# Patient Record
Sex: Male | Born: 1955 | Race: White | Hispanic: No | Marital: Married | Smoking: Former smoker
Health system: Southern US, Community
[De-identification: ages and names within clinical notes are randomized; demographics above are authoritative.]

## PROBLEM LIST (undated history)

## (undated) DIAGNOSIS — I10 Essential (primary) hypertension: Secondary | ICD-10-CM

## (undated) DIAGNOSIS — E119 Type 2 diabetes mellitus without complications: Secondary | ICD-10-CM

## (undated) DIAGNOSIS — F32A Depression, unspecified: Secondary | ICD-10-CM

## (undated) DIAGNOSIS — F419 Anxiety disorder, unspecified: Secondary | ICD-10-CM

## (undated) DIAGNOSIS — F329 Major depressive disorder, single episode, unspecified: Secondary | ICD-10-CM

## (undated) DIAGNOSIS — E7800 Pure hypercholesterolemia, unspecified: Secondary | ICD-10-CM

## (undated) DIAGNOSIS — M1990 Unspecified osteoarthritis, unspecified site: Secondary | ICD-10-CM

---

## 1975-07-18 HISTORY — PX: PILONIDAL CYST EXCISION: SHX744

## 2001-07-15 ENCOUNTER — Other Ambulatory Visit: Admission: RE | Admit: 2001-07-15 | Discharge: 2001-07-15 | Payer: Self-pay | Admitting: Dermatology

## 2002-01-09 ENCOUNTER — Other Ambulatory Visit: Admission: RE | Admit: 2002-01-09 | Discharge: 2002-01-09 | Payer: Self-pay | Admitting: Dermatology

## 2003-05-23 ENCOUNTER — Ambulatory Visit (HOSPITAL_COMMUNITY): Admission: RE | Admit: 2003-05-23 | Discharge: 2003-05-23 | Payer: Self-pay | Admitting: Family Medicine

## 2005-06-05 ENCOUNTER — Other Ambulatory Visit: Admission: RE | Admit: 2005-06-05 | Discharge: 2005-06-05 | Payer: Self-pay | Admitting: Dermatology

## 2014-08-05 NOTE — Patient Instructions (Addendum)
Greg Oneill  08/05/2014   Your procedure is scheduled on:   08/10/2014  Report to [facility] at  51  AM.  Call this number if you have problems the morning of surgery: [phone]   Remember:   Do not eat food or drink liquids after midnight.   Take these medicines the morning of surgery with A SIP OF WATER: lisinopril and lexapro   Do not wear jewelry, make-up or nail polish.  Do not wear lotions, powders, or perfumes.   Do not shave 48 hours prior to surgery. Men may shave face and neck.  Do not bring valuables to the hospital.  [facility] is not responsible for any belongings or valuables.               Contacts, dentures or bridgework may not be worn into surgery.  Leave suitcase in the car. After surgery it may be brought to your room.  For patients admitted to the hospital, discharge time is determined by your treatment team.               Patients discharged the day of surgery will not be allowed to drive home.  Name and phone number of your driver: family  Special Instructions: Shower using CHG 2 nights before surgery and the night before surgery.  If you shower the day of surgery use CHG.  Use special wash - you have one bottle of CHG for all showers.  You should use approximately 1/3 of the bottle for each shower.   Please read over the following fact sheets that you were given: Pain Booklet, Coughing and Deep Breathing, Surgical Site Infection Prevention, Anesthesia Post-op Instructions and Care and Recovery After Surgery Hernia A hernia occurs when an internal organ pushes out through a weak spot in the abdominal wall. Hernias most commonly occur in the groin and around the navel. Hernias often can be pushed back into place (reduced). Most hernias tend to get worse over time. Some abdominal hernias can get stuck in the opening (irreducible or incarcerated hernia) and cannot be reduced. An irreducible abdominal hernia which is tightly squeezed into the opening  is at risk for impaired blood supply (strangulated hernia). A strangulated hernia is a medical emergency. Because of the risk for an irreducible or strangulated hernia, surgery may be recommended to repair a hernia. CAUSES   Heavy lifting.  Prolonged coughing.  Straining to have a bowel movement.  A cut (incision) made during an abdominal surgery. HOME CARE INSTRUCTIONS   Bed rest is not required. You may continue your normal activities.  Avoid lifting more than 10 pounds (4.5 kg) or straining.  Cough gently. If you are a smoker it is best to stop. Even the best hernia repair can break down with the continual strain of coughing. Even if you do not have your hernia repaired, a cough will continue to aggravate the problem.  Do not wear anything tight over your hernia. Do not try to keep it in with an outside bandage or truss. These can damage abdominal contents if they are trapped within the hernia sac.  Eat a normal diet.  Avoid constipation. Straining over long periods of time will increase hernia size and encourage breakdown of repairs. If you cannot do this with diet alone, stool softeners may be used. SEEK IMMEDIATE MEDICAL CARE IF:   You have a fever.  You develop increasing abdominal pain.  You feel nauseous or vomit.  Your hernia  is stuck outside the abdomen, looks discolored, feels hard, or is tender.  You have any changes in your bowel habits or in the hernia that are unusual for you.  You have increased pain or swelling around the hernia.  You cannot push the hernia back in place by applying gentle pressure while lying down. MAKE SURE YOU:   Understand these instructions.  Will watch your condition.  Will get help right away if you are not doing well or get worse. Document Released: 07/03/2005 Document Revised: 09/25/2011 Document Reviewed: 02/20/2008 [facility] Patient Information 2015 [place], [place]. This information is not intended to replace advice given to  you by your health care provider. Make sure you discuss any questions you have with your health care provider. PATIENT INSTRUCTIONS POST-ANESTHESIA  IMMEDIATELY FOLLOWING SURGERY:  Do not drive or operate machinery for the first twenty four hours after surgery.  Do not make any important decisions for twenty four hours after surgery or while taking narcotic pain medications or sedatives.  If you develop intractable nausea and vomiting or a severe headache please notify your doctor immediately.  FOLLOW-UP:  Please make an appointment with your surgeon as instructed. You do not need to follow up with anesthesia unless specifically instructed to do so.  WOUND CARE INSTRUCTIONS (if applicable):  Keep a dry clean dressing on the anesthesia/puncture wound site if there is drainage.  Once the wound has quit draining you may leave it open to air.  Generally you should leave the bandage intact for twenty four hours unless there is drainage.  If the epidural site drains for more than 36-48 hours please call the anesthesia department.  QUESTIONS?:  Please feel free to call your physician or the hospital operator if you have any questions, and they will be happy to assist you.

## 2014-08-05 NOTE — H&P (Signed)
  NTS SOAP Note  Vital Signs:  Vitals as of: 3/49/1791: Systolic 505: Diastolic 80: Heart Rate 78: Temp 59F: Height 59ft 0in: Weight 250Lbs 0 Ounces: BMI 33.91  BMI : 33.91 kg/m2  Subjective: This 59 year old male presents for of an umbilical hernia.  Has been present for several months, made worse with straining.  Review of Symptoms:  Constitutional:unremarkable   Head:unremarkable Eyes:unremarkable   Nose/Mouth/Throat:unremarkable Cardiovascular:  unremarkable Respiratory:unremarkable Gastrointestinal:  unremarkable   Genitourinary:unremarkable   Musculoskeletal:unremarkable Skin:unremarkable Hematolgic/Lymphatic:unremarkable   Allergic/Immunologic:unremarkable   Past Medical History:  Reviewed  Past Medical History  Surgical History: unremarkable Medical Problems: NIDDM, anxiety  Allergies: nkda Medications: vytorin,  lexapro,  metformin,  alfuzosin   Social History:Reviewed  Social History  Preferred Language: English Race:  White Ethnicity: Not Hispanic / Latino Age: 15 year Marital Status:  M Alcohol:  no   Smoking Status: Never smoker reviewed on 08/04/2014 Functional Status reviewed on 08/04/2014 ------------------------------------------------ Bathing: Normal Cooking: Normal Dressing: Normal Driving: Normal Eating: Normal Managing Meds: Normal Oral Care: Normal Shopping: Normal Toileting: Normal Transferring: Normal Walking: Normal Cognitive Status reviewed on 08/04/2014 ------------------------------------------------ Attention: Normal Decision Making: Normal Language: Normal Memory: Normal Motor: Normal Perception: Normal Problem Solving: Normal Visual and Spatial: Normal   Family History:Reviewed  Family Health History Mother, Living; Healthy;  Father, Living; Healthy;     Objective Information: General:Well appearing, well nourished in no distress. Heart:RRR, no murmur or gallop.  Normal S1, S2.   No S3, S4.  Lungs:  CTA bilaterally, no wheezes, rhonchi, rales.  Breathing unlabored. Abdomen:Soft, NT/ND, no HSM, no masses.  Reducible umbilical hernia.  Assessment:Umbilical hernia  Diagnoses: 697.9  Y80.1 Umbilical hernia (Umbilical hernia without obstruction or gangrene)  Procedures: 65537 - OFFICE OUTPATIENT NEW 30 MINUTES    Plan:  Scheduled for umbilical herniorrhaphy with mesh on 08/10/14.   Patient Education:Alternative treatments to surgery were discussed with patient (and family).  Risks and benefits  of procedure including bleeding,  infection,  mesh use,  and recurrence of the hernia were fully explained to the patient (and family) who gave informed consent. Patient/family questions were addressed.  Follow-up:Pending Surgery

## 2014-08-06 ENCOUNTER — Other Ambulatory Visit: Payer: Self-pay

## 2014-08-06 ENCOUNTER — Encounter (HOSPITAL_COMMUNITY): Payer: Self-pay

## 2014-08-06 ENCOUNTER — Encounter (HOSPITAL_COMMUNITY)
Admission: RE | Admit: 2014-08-06 | Discharge: 2014-08-06 | Disposition: A | Discharge status: Home / Self Care | Payer: 59 | Source: Ambulatory Visit | Attending: General Surgery | Admitting: General Surgery

## 2014-08-06 DIAGNOSIS — K429 Umbilical hernia without obstruction or gangrene: Secondary | ICD-10-CM | POA: Insufficient documentation

## 2014-08-06 DIAGNOSIS — Z01818 Encounter for other preprocedural examination: Secondary | ICD-10-CM | POA: Diagnosis not present

## 2014-08-06 HISTORY — DX: Type 2 diabetes mellitus without complications: E11.9

## 2014-08-06 HISTORY — DX: Depression, unspecified: F32.A

## 2014-08-06 HISTORY — DX: Unspecified osteoarthritis, unspecified site: M19.90

## 2014-08-06 HISTORY — DX: Major depressive disorder, single episode, unspecified: F32.9

## 2014-08-06 LAB — CBC WITH DIFFERENTIAL/PLATELET
Basophils Absolute: 0 10*3/uL (ref 0.0–0.1)
Basophils Relative: 1 % (ref 0–1)
Eosinophils Absolute: 0.1 10*3/uL (ref 0.0–0.7)
Eosinophils Relative: 2 % (ref 0–5)
HCT: 40.8 % (ref 39.0–52.0)
Hemoglobin: 14.5 g/dL (ref 13.0–17.0)
Lymphocytes Relative: 28 % (ref 12–46)
Lymphs Abs: 1.4 10*3/uL (ref 0.7–4.0)
MCH: 31.9 pg (ref 26.0–34.0)
MCHC: 35.5 g/dL (ref 30.0–36.0)
MCV: 89.9 fL (ref 78.0–100.0)
Monocytes Absolute: 0.4 10*3/uL (ref 0.1–1.0)
Monocytes Relative: 7 % (ref 3–12)
Neutro Abs: 3.3 10*3/uL (ref 1.7–7.7)
Neutrophils Relative %: 62 % (ref 43–77)
Platelets: 167 10*3/uL (ref 150–400)
RBC: 4.54 MIL/uL (ref 4.22–5.81)
RDW: 12.9 % (ref 11.5–15.5)
WBC: 5.2 10*3/uL (ref 4.0–10.5)

## 2014-08-06 LAB — BASIC METABOLIC PANEL
Anion gap: 9 (ref 5–15)
BUN: 20 mg/dL (ref 6–23)
CO2: 23 mmol/L (ref 19–32)
Calcium: 8.8 mg/dL (ref 8.4–10.5)
Chloride: 104 mEq/L (ref 96–112)
Creatinine, Ser: 0.93 mg/dL (ref 0.50–1.35)
GFR calc Af Amer: >90 mL/min (ref >90)
GFR calc non Af Amer: 89 mL/min — ABNORMAL LOW (ref >90)
Glucose, Bld: 117 mg/dL — ABNORMAL HIGH (ref 70–99)
Potassium: 3.7 mmol/L (ref 3.5–5.1)
Sodium: 136 mmol/L (ref 135–145)

## 2014-08-06 MED ORDER — CHLORHEXIDINE GLUCONATE 4 % EX LIQD: 1.0000 "application " | Freq: Once | CUTANEOUS | Status: DC

## 2014-08-10 ENCOUNTER — Ambulatory Visit (HOSPITAL_COMMUNITY): Payer: 59 | Admitting: Anesthesiology

## 2014-08-10 ENCOUNTER — Encounter (HOSPITAL_COMMUNITY): Payer: Self-pay | Admitting: *Deleted

## 2014-08-10 ENCOUNTER — Encounter (HOSPITAL_COMMUNITY)
Admission: RE | Disposition: A | Discharge status: Home / Self Care | Payer: Self-pay | Source: Ambulatory Visit | Attending: General Surgery

## 2014-08-10 ENCOUNTER — Ambulatory Visit (HOSPITAL_COMMUNITY)
Admission: RE | Admit: 2014-08-10 | Discharge: 2014-08-10 | Disposition: A | Discharge status: Home / Self Care | Payer: 59 | Source: Ambulatory Visit | Attending: General Surgery | Admitting: General Surgery

## 2014-08-10 DIAGNOSIS — E119 Type 2 diabetes mellitus without complications: Secondary | ICD-10-CM | POA: Diagnosis not present

## 2014-08-10 DIAGNOSIS — F419 Anxiety disorder, unspecified: Secondary | ICD-10-CM | POA: Insufficient documentation

## 2014-08-10 DIAGNOSIS — K429 Umbilical hernia without obstruction or gangrene: Secondary | ICD-10-CM | POA: Insufficient documentation

## 2014-08-10 HISTORY — PX: INSERTION OF MESH: SHX5868

## 2014-08-10 HISTORY — PX: UMBILICAL HERNIA REPAIR: SHX196

## 2014-08-10 LAB — GLUCOSE, CAPILLARY
GLUCOSE-CAPILLARY: 106 mg/dL — AB (ref 70–99)
Glucose-Capillary: 117 mg/dL — ABNORMAL HIGH (ref 70–99)

## 2014-08-10 SURGERY — REPAIR, HERNIA, UMBILICAL, ADULT
Anesthesia: General | Site: Abdomen

## 2014-08-10 MED ORDER — KETOROLAC TROMETHAMINE 30 MG/ML IJ SOLN
INTRAMUSCULAR | Status: AC
  Filled 2014-08-10: qty 1

## 2014-08-10 MED ORDER — MIDAZOLAM HCL 2 MG/2ML IJ SOLN
1.0000 mg | INTRAMUSCULAR | Status: DC | PRN
  Administered 2014-08-10: 2 mg via INTRAVENOUS

## 2014-08-10 MED ORDER — CEFAZOLIN SODIUM-DEXTROSE 2-3 GM-% IV SOLR
2.0000 g | INTRAVENOUS | Status: AC
  Administered 2014-08-10: 2 g via INTRAVENOUS
  Filled 2014-08-10: qty 50

## 2014-08-10 MED ORDER — LACTATED RINGERS IV SOLN
INTRAVENOUS | Status: DC
  Administered 2014-08-10: 1000 mL via INTRAVENOUS

## 2014-08-10 MED ORDER — FENTANYL CITRATE 0.05 MG/ML IJ SOLN
25.0000 ug | INTRAMUSCULAR | Status: AC
  Administered 2014-08-10 (×2): 25 ug via INTRAVENOUS

## 2014-08-10 MED ORDER — MIDAZOLAM HCL 2 MG/2ML IJ SOLN
INTRAMUSCULAR | Status: AC
  Filled 2014-08-10: qty 2

## 2014-08-10 MED ORDER — FENTANYL CITRATE 0.05 MG/ML IJ SOLN
INTRAMUSCULAR | Status: AC
  Filled 2014-08-10: qty 5

## 2014-08-10 MED ORDER — BUPIVACAINE HCL (PF) 0.5 % IJ SOLN
INTRAMUSCULAR | Status: DC | PRN
  Administered 2014-08-10: 6 mL

## 2014-08-10 MED ORDER — 0.9 % SODIUM CHLORIDE (POUR BTL) OPTIME
TOPICAL | Status: DC | PRN
  Administered 2014-08-10: 1000 mL

## 2014-08-10 MED ORDER — PROPOFOL 10 MG/ML IV BOLUS
INTRAVENOUS | Status: DC | PRN
  Administered 2014-08-10: 150 mg via INTRAVENOUS

## 2014-08-10 MED ORDER — BUPIVACAINE HCL (PF) 0.5 % IJ SOLN
INTRAMUSCULAR | Status: AC
  Filled 2014-08-10: qty 30

## 2014-08-10 MED ORDER — POVIDONE-IODINE 10 % EX OINT
TOPICAL_OINTMENT | CUTANEOUS | Status: AC
  Filled 2014-08-10: qty 1

## 2014-08-10 MED ORDER — OXYCODONE-ACETAMINOPHEN 7.5-325 MG PO TABS: 1.0000 | ORAL_TABLET | ORAL | Status: DC | PRN

## 2014-08-10 MED ORDER — FENTANYL CITRATE 0.05 MG/ML IJ SOLN
INTRAMUSCULAR | Status: AC
  Filled 2014-08-10: qty 2

## 2014-08-10 MED ORDER — FENTANYL CITRATE 0.05 MG/ML IJ SOLN
INTRAMUSCULAR | Status: DC | PRN
  Administered 2014-08-10: 50 ug via INTRAVENOUS

## 2014-08-10 MED ORDER — ONDANSETRON HCL 4 MG/2ML IJ SOLN: 4.0000 mg | Freq: Once | INTRAMUSCULAR | Status: DC | PRN

## 2014-08-10 MED ORDER — PROPOFOL 10 MG/ML IV BOLUS
INTRAVENOUS | Status: AC
  Filled 2014-08-10: qty 20

## 2014-08-10 MED ORDER — LIDOCAINE HCL (PF) 1 % IJ SOLN
INTRAMUSCULAR | Status: AC
  Filled 2014-08-10: qty 5

## 2014-08-10 MED ORDER — KETOROLAC TROMETHAMINE 30 MG/ML IJ SOLN
30.0000 mg | Freq: Once | INTRAMUSCULAR | Status: AC
  Administered 2014-08-10: 30 mg via INTRAVENOUS

## 2014-08-10 MED ORDER — ONDANSETRON HCL 4 MG/2ML IJ SOLN
INTRAMUSCULAR | Status: AC
  Filled 2014-08-10: qty 2

## 2014-08-10 MED ORDER — POVIDONE-IODINE 10 % OINT PACKET
TOPICAL_OINTMENT | CUTANEOUS | Status: DC | PRN
  Administered 2014-08-10: 1 via TOPICAL

## 2014-08-10 MED ORDER — FENTANYL CITRATE 0.05 MG/ML IJ SOLN: 25.0000 ug | INTRAMUSCULAR | Status: DC | PRN

## 2014-08-10 MED ORDER — LIDOCAINE HCL (CARDIAC) 10 MG/ML IV SOLN
INTRAVENOUS | Status: DC | PRN
  Administered 2014-08-10: 20 mg via INTRAVENOUS

## 2014-08-10 MED ORDER — ONDANSETRON HCL 4 MG/2ML IJ SOLN
4.0000 mg | Freq: Once | INTRAMUSCULAR | Status: AC
  Administered 2014-08-10: 4 mg via INTRAVENOUS

## 2014-08-10 SURGICAL SUPPLY — 37 items
BAG HAMPER (MISCELLANEOUS) ×3 IMPLANT
BLADE 11 SAFETY STRL DISP (BLADE) IMPLANT
CHLORAPREP W/TINT 26ML (MISCELLANEOUS) ×3 IMPLANT
CLOTH BEACON ORANGE TIMEOUT ST (SAFETY) ×3 IMPLANT
COVER LIGHT HANDLE STERIS (MISCELLANEOUS) ×6 IMPLANT
DECANTER SPIKE VIAL GLASS SM (MISCELLANEOUS) ×3 IMPLANT
ELECT REM PT RETURN 9FT ADLT (ELECTROSURGICAL) ×3
ELECTRODE REM PT RTRN 9FT ADLT (ELECTROSURGICAL) ×1 IMPLANT
GLOVE BIOGEL M 7.0 STRL (GLOVE) ×3 IMPLANT
GLOVE BIOGEL PI IND STRL 7.0 (GLOVE) ×2 IMPLANT
GLOVE BIOGEL PI INDICATOR 7.0 (GLOVE) ×4
GLOVE ECLIPSE 6.5 STRL STRAW (GLOVE) ×3 IMPLANT
GLOVE EXAM NITRILE MD LF STRL (GLOVE) ×3 IMPLANT
GLOVE SURG SS PI 7.5 STRL IVOR (GLOVE) ×6 IMPLANT
GOWN STRL REUS W/ TWL XL LVL3 (GOWN DISPOSABLE) ×1 IMPLANT
GOWN STRL REUS W/TWL LRG LVL3 (GOWN DISPOSABLE) ×6 IMPLANT
GOWN STRL REUS W/TWL XL LVL3 (GOWN DISPOSABLE) ×2
INST SET MINOR GENERAL (KITS) ×3 IMPLANT
KIT ROOM TURNOVER APOR (KITS) ×3 IMPLANT
MANIFOLD NEPTUNE II (INSTRUMENTS) ×3 IMPLANT
MESH VENTRALEX ST 1-7/10 CRC S (Mesh General) ×3 IMPLANT
NEEDLE HYPO 25X1 1.5 SAFETY (NEEDLE) ×3 IMPLANT
NS IRRIG 1000ML POUR BTL (IV SOLUTION) ×3 IMPLANT
PACK MINOR (CUSTOM PROCEDURE TRAY) ×3 IMPLANT
PAD ARMBOARD 7.5X6 YLW CONV (MISCELLANEOUS) ×3 IMPLANT
SET BASIN LINEN APH (SET/KITS/TRAYS/PACK) ×3 IMPLANT
SPONGE GAUZE 2X2 8PLY STER LF (GAUZE/BANDAGES/DRESSINGS) ×2
SPONGE GAUZE 2X2 8PLY STRL LF (GAUZE/BANDAGES/DRESSINGS) ×4 IMPLANT
STAPLER VISISTAT (STAPLE) ×3 IMPLANT
SUT ETHIBOND NAB MO 7 #0 18IN (SUTURE) ×3 IMPLANT
SUT VIC AB 2-0 CT2 27 (SUTURE) ×3 IMPLANT
SUT VIC AB 3-0 SH 27 (SUTURE) ×2
SUT VIC AB 3-0 SH 27X BRD (SUTURE) ×1 IMPLANT
SUT VICRYL AB 3 0 TIES (SUTURE) IMPLANT
SYR CONTROL 10ML LL (SYRINGE) ×3 IMPLANT
TAPE CLOTH SURG 4X10 WHT LF (GAUZE/BANDAGES/DRESSINGS) ×3 IMPLANT
YANKAUER SUCT BULB TIP NO VENT (SUCTIONS) ×3 IMPLANT

## 2014-08-10 NOTE — Anesthesia Postprocedure Evaluation (Signed)
  Anesthesia Post-op Note  Patient: Greg Oneill  Procedure(s) Performed: Procedure(s): UMBILICAL HERNIORRHAPHY  (N/A) INSERTION OF MESH (N/A)  Patient Location: PACU  Anesthesia Type:General  Level of Consciousness: awake, alert  and patient cooperative  Airway and Oxygen Therapy: Patient Spontanous Breathing  Post-op Pain: mild  Post-op Assessment: Post-op Vital signs reviewed, Patient's Cardiovascular Status Stable, Respiratory Function Stable, Patent Airway and No signs of Nausea or vomiting  Post-op Vital Signs: Reviewed and stable  Last Vitals:  Filed Vitals:   08/10/14 1145  BP: 157/79  Pulse: 65  Temp:   Resp: 11    Complications: No apparent anesthesia complications

## 2014-08-10 NOTE — Anesthesia Preprocedure Evaluation (Signed)
Anesthesia Evaluation  Patient identified by MRN, date of birth, ID band Patient awake    Reviewed: Allergy & Precautions, NPO status , Patient's Chart, lab work & pertinent test results  Airway Mallampati: I  TM Distance: >3 FB     Dental  (+) Teeth Intact, Dental Advisory Given   Pulmonary neg pulmonary ROS,  breath sounds clear to auscultation        Cardiovascular negative cardio ROS  Rhythm:Regular Rate:Normal     Neuro/Psych PSYCHIATRIC DISORDERS Depression    GI/Hepatic negative GI ROS,   Endo/Other  diabetes, Well Controlled, Type 2, Oral Hypoglycemic Agents  Renal/GU      Musculoskeletal  (+) Arthritis -,   Abdominal   Peds  Hematology   Anesthesia Other Findings   Reproductive/Obstetrics                             Anesthesia Physical Anesthesia Plan  ASA: II  Anesthesia Plan: General   Post-op Pain Management:    Induction: Intravenous  Airway Management Planned: LMA  Additional Equipment:   Intra-op Plan:   Post-operative Plan: Extubation in OR  Informed Consent: I have reviewed the patients History and Physical, chart, labs and discussed the procedure including the risks, benefits and alternatives for the proposed anesthesia with the patient or authorized representative who has indicated his/her understanding and acceptance.     Plan Discussed with:   Anesthesia Plan Comments:         Anesthesia Quick Evaluation

## 2014-08-10 NOTE — Interval H&P Note (Signed)
History and Physical Interval Note:  08/10/2014 10:16 AM  Greg Oneill  has presented today for surgery, with the diagnosis of umbilical hernia  The various methods of treatment have been discussed with the patient and family. After consideration of risks, benefits and other options for treatment, the patient has consented to  Procedure(s): HERNIA REPAIR UMBILICAL ADULT WITH MESH (N/A) as a surgical intervention .  The patient's history has been reviewed, patient examined, no change in status, stable for surgery.  I have reviewed the patient's chart and labs.  Questions were answered to the patient's satisfaction.     [name] A

## 2014-08-10 NOTE — Transfer of Care (Signed)
Immediate Anesthesia Transfer of Care Note  Patient: Greg Oneill  Procedure(s) Performed: Procedure(s): UMBILICAL HERNIORRHAPHY  (N/A) INSERTION OF MESH (N/A)  Patient Location: PACU  Anesthesia Type:General  Level of Consciousness: awake and patient cooperative  Airway & Oxygen Therapy: Patient Spontanous Breathing and non-rebreather face mask  Post-op Assessment: Report given to PACU RN, Post -op Vital signs reviewed and stable and Patient moving all extremities  Post vital signs: Reviewed and stable  Complications: No apparent anesthesia complications

## 2014-08-10 NOTE — Anesthesia Procedure Notes (Signed)
Procedure Name: LMA Insertion Date/Time: 08/10/2014 10:32 AM Performed by: Vista Deck Pre-anesthesia Checklist: Patient identified, Patient being monitored, Emergency Drugs available, Timeout performed and Suction available Patient Re-evaluated:Patient Re-evaluated prior to inductionOxygen Delivery Method: Circle System Utilized Preoxygenation: Pre-oxygenation with 100% oxygen Intubation Type: IV induction Ventilation: Mask ventilation without difficulty LMA: LMA inserted LMA Size: 4.0 Number of attempts: 1 Placement Confirmation: positive ETCO2 and breath sounds checked- equal and bilateral Tube secured with: Tape

## 2014-08-10 NOTE — Discharge Instructions (Signed)
Umbilical Herniorrhaphy, Care After °Refer to this sheet in the next few weeks. These instructions provide you with information on caring for yourself after your procedure. Your health care provider may also give you more specific instructions. Your treatment has been planned according to current medical practices, but problems sometimes occur. Call your health care provider if you have any problems or questions after your procedure. °HOME CARE INSTRUCTIONS °· If you are given antibiotic medicine, take it as directed. Finish it even if you start to feel better. °· Only take over-the-counter or prescription medicines for pain, fever, or discomfort as directed by your health care provider. Do not take aspirin. It can cause bleeding. °· Do not get your surgical cut (incision) area wet unless your health care provider says it is okay. °· Avoid lifting objects heavier than 10 lb (4.5 kg) for 8 weeks after surgery. °· Avoid sexual activity for 5 weeks after surgery or as directed by your health care provider. °· Do not drive while taking prescription pain medicine. °· You may return to your other normal, daily activities after 3 days or as directed by your health care provider. °SEEK MEDICAL CARE IF: °· You notice blood or fluid leaking from the surgical site. °· Your incision area becomes red or swollen. °· Your pain at the surgical site becomes worse or is not relieved by medicine. °· You have problems urinating. °· You feel nauseous or vomit more than 2 days after surgery. °· You notice the bulge in your abdomen returns after the procedure. °SEEK IMMEDIATE MEDICAL CARE IF: °· You have a fever. °· You have nausea or vomiting that will not stop. °Document Released: 01/02/2012 Document Revised: 11/17/2013 Document Reviewed: 01/02/2012 °[employer]® Patient Information ©2015 [employer]. This information is not intended to replace advice given to you by your health care provider. Make sure you discuss any questions you have  with your health care provider. ° °

## 2014-08-10 NOTE — Op Note (Signed)
Patient:  Greg Oneill  DOB:  06-15-1956  MRN:  [id number]   Preop Diagnosis:  Umbilical hernia  Postop Diagnosis:  Same  Procedure:  Umbilical herniorrhaphy with mesh  Surgeon:  [name], M.D.  Anes:  Gen. endotracheal  Indications:  Patient is a 59 year old white male who presents with a symptomatic umbilical hernia. The risks and benefits of the procedure including bleeding, infection, mesh use, and the possibility of recurrence of the hernia were fully explained to the patient, who gave informed consent.  Procedure note:  The patient was placed the supine position. After induction of general endotracheal anesthesia, the abdomen was prepped and draped using usual sterile technique with DuraPrep. Surgical site confirmation was performed.  An infraumbilical incision was made down to the fascia. The umbilicus was freed away from the underlying fascia. A hernia sac was found. This was excised at the fascial level without difficulty. Omentum was then freed away from the side of the abdominal wall and reduced. A 4.3 cm Bard Ventralex ST patch was then inserted into the hernia defect and secured to the fascia using 0 Ethibond interrupted sutures. The overlying fascia was reapproximated transversely using 0 Ethibond interrupted sutures. The base the umbilicus was secured back to the fascia using a 2-0 Vicryl interrupted suture. The subcutaneous layer was reapproximated using a 3-0 Vicryl interrupted suture. The skin was closed using staples. 0.5% Sensorcaine was instilled into the surrounding wound. Betadine ointment and dry sterile dressings were applied.  All tape and needle counts were correct at the end of the procedure. The patient was extubated in the operating room and transferred to PACU in stable condition.  Complications:  None  EBL:  Minimal  Specimen:  None

## 2014-08-11 ENCOUNTER — Encounter (HOSPITAL_COMMUNITY): Payer: Self-pay | Admitting: General Surgery

## 2015-10-13 ENCOUNTER — Ambulatory Visit (HOSPITAL_COMMUNITY)
Admission: RE | Admit: 2015-10-13 | Discharge: 2015-10-13 | Disposition: A | Discharge status: Home / Self Care | Payer: 59 | Source: Ambulatory Visit | Attending: Internal Medicine | Admitting: Internal Medicine

## 2015-10-13 ENCOUNTER — Other Ambulatory Visit (HOSPITAL_COMMUNITY): Payer: Self-pay | Admitting: Internal Medicine

## 2015-10-13 DIAGNOSIS — R05 Cough: Secondary | ICD-10-CM

## 2015-10-13 DIAGNOSIS — R059 Cough, unspecified: Secondary | ICD-10-CM

## 2015-11-25 ENCOUNTER — Encounter: Payer: Self-pay | Admitting: Podiatry

## 2015-11-25 ENCOUNTER — Ambulatory Visit (INDEPENDENT_AMBULATORY_CARE_PROVIDER_SITE_OTHER): Payer: 59 | Admitting: Podiatry

## 2015-11-25 VITALS — BP 161/85 | HR 62 | Resp 14

## 2015-11-25 DIAGNOSIS — M79676 Pain in unspecified toe(s): Secondary | ICD-10-CM

## 2015-11-25 DIAGNOSIS — B351 Tinea unguium: Secondary | ICD-10-CM

## 2015-11-25 DIAGNOSIS — M2010 Hallux valgus (acquired), unspecified foot: Secondary | ICD-10-CM | POA: Diagnosis not present

## 2015-11-25 DIAGNOSIS — E119 Type 2 diabetes mellitus without complications: Secondary | ICD-10-CM

## 2015-11-25 DIAGNOSIS — M2040 Other hammer toe(s) (acquired), unspecified foot: Secondary | ICD-10-CM

## 2015-11-25 NOTE — Progress Notes (Signed)
Subjective:     Patient ID: Greg Oneill, male   DOB: 10/22/1955, 60 y.o.   MRN: [id number]  HPI this patient presents the office today stating that he was referred to this office by nurse practitioner in [place]. This patient is a diabetic and has developed thick nails and painful corns on right foot.  He states that he works many hours which requires standing and walking on his feet. He presents the office today for an evaluation of his diabetic feet as well as an evaluation of the painful corn, second toe, right foot   Review of Systems     Objective:   Physical Exam GENERAL APPEARANCE: Alert, conversant. Appropriately groomed. No acute distress.  VASCULAR: Pedal pulses are  palpable at  [facility] and PT bilateral.  Capillary refill time is immediate to all digits,  Normal temperature gradient.  Digital hair growth is present bilateral  NEUROLOGIC: sensation is normal to 5.07 monofilament at 5/5 sites bilateral.  Light touch is intact bilateral, Muscle strength normal.  MUSCULOSKELETAL: acceptable muscle strength, tone and stability bilateral.  Intrinsic muscluature intact bilateral.  Rectus appearance of foot and digits noted bilateral. HAV 1st MPJ  B/L.  Overlapping second toe B/l.  DERMATOLOGIC: skin color, texture, and turgor are within normal limits.  No preulcerative lesions or ulcers  are seen, no interdigital maceration noted.  No open lesions present.   No drainage noted.  Asymptomatic pinch callus noted.  NAILS  Thick disfigured discolored nails both feet.hallux B/L      Assessment:     HAV  B/L  Hammer toes B/L  Onychomycosis hallux B/l     Plan:     IE  Performed . Diabetic foot exam diagnosis. Patient is having good vascular, neurologic and muscle power. During my diabetic exam. He does have HAV deformity with overlapping second bilateral debridement of the long thick hallux toenails was performed   IE  Diabetic foot exam.  Debride nails.   Gardiner Barefoot DPM

## 2016-01-10 ENCOUNTER — Ambulatory Visit (INDEPENDENT_AMBULATORY_CARE_PROVIDER_SITE_OTHER): Payer: 59 | Admitting: Otolaryngology

## 2016-01-10 DIAGNOSIS — H9011 Conductive hearing loss, unilateral, right ear, with unrestricted hearing on the contralateral side: Secondary | ICD-10-CM | POA: Diagnosis not present

## 2016-01-10 DIAGNOSIS — H6981 Other specified disorders of Eustachian tube, right ear: Secondary | ICD-10-CM | POA: Diagnosis not present

## 2016-01-10 DIAGNOSIS — H6123 Impacted cerumen, bilateral: Secondary | ICD-10-CM

## 2016-02-07 ENCOUNTER — Ambulatory Visit (INDEPENDENT_AMBULATORY_CARE_PROVIDER_SITE_OTHER): Payer: 59 | Admitting: Otolaryngology

## 2016-02-07 DIAGNOSIS — H6983 Other specified disorders of Eustachian tube, bilateral: Secondary | ICD-10-CM

## 2016-02-07 DIAGNOSIS — H903 Sensorineural hearing loss, bilateral: Secondary | ICD-10-CM

## 2016-02-07 DIAGNOSIS — H9313 Tinnitus, bilateral: Secondary | ICD-10-CM | POA: Diagnosis not present

## 2016-05-23 ENCOUNTER — Encounter: Payer: Self-pay | Admitting: Orthopaedic Surgery

## 2016-05-23 ENCOUNTER — Ambulatory Visit (INDEPENDENT_AMBULATORY_CARE_PROVIDER_SITE_OTHER): Payer: 59

## 2016-05-23 ENCOUNTER — Ambulatory Visit (INDEPENDENT_AMBULATORY_CARE_PROVIDER_SITE_OTHER): Payer: 59 | Admitting: Orthopaedic Surgery

## 2016-05-23 VITALS — BP 148/84 | HR 78 | Temp 97.3°F | Ht 71.0 in | Wt 242.0 lb

## 2016-05-23 DIAGNOSIS — M25562 Pain in left knee: Secondary | ICD-10-CM

## 2016-05-23 MED ORDER — NAPROXEN 500 MG PO TABS: 500.0000 mg | ORAL_TABLET | Freq: Two times a day (BID) | ORAL | 5 refills | Status: DC

## 2016-05-23 NOTE — Progress Notes (Signed)
Patient Greg Oneill, male DOB:October 10, 1955, 60 y.o. [id number]  Chief Complaint  Patient presents with  . Follow-up    Left knee c/o pain going down leg    HPI  Greg Oneill is a 60 y.o. male who has had pain of the left anterior lower leg at times.  The pain lasts just a few seconds but it intense.  He has no swelling, no redness, no trauma.  He has seen no pattern.  It can happen at night in bed or during the day when walking. He has had a slight tenderness of the left knee at times but nothing more than usual he states.  He has not done anything for this with medicine or heat or ice. HPI  Body mass index is 33.75 kg/m.  ROS  Review of Systems  HENT: Negative for congestion.   Respiratory: Negative for cough and shortness of breath.   Cardiovascular: Negative for chest pain and leg swelling.  Endocrine: Negative for cold intolerance.  Musculoskeletal: Positive for arthralgias.    Past Medical History:  Diagnosis Date  . Arthritis   . Depression   . Diabetes mellitus without complication [facility])     Past Surgical History:  Procedure Laterality Date  . INSERTION OF MESH N/A 08/10/2014   Procedure: INSERTION OF MESH;  Surgeon: Jamesetta So, MD;  Location: [facility];  Service: General;  Laterality: N/A;  . PILONIDAL CYST EXCISION  1977   Dr Greg Oneill  . UMBILICAL HERNIA REPAIR N/A 08/10/2014   Procedure: UMBILICAL HERNIORRHAPHY ;  Surgeon: Jamesetta So, MD;  Location: [facility];  Service: General;  Laterality: N/A;    History reviewed. No pertinent family history.  Social History Social History  Substance Use Topics  . Smoking status: Never Smoker  . Smokeless tobacco: Former Systems developer  . Alcohol use No    No Known Allergies  Current Outpatient Prescriptions  Medication Sig Dispense Refill  . alfuzosin (UROXATRAL) 10 MG 24 hr tablet Take 10 mg by mouth daily with breakfast.    . escitalopram (LEXAPRO) 10 MG tablet Take 10 mg by mouth daily.    Greg Oneill  ezetimibe-simvastatin (VYTORIN) 10-20 MG per tablet Take 1 tablet by mouth daily.    Greg Oneill lisinopril (PRINIVIL,ZESTRIL) 10 MG tablet Take 10 mg by mouth daily.    . metFORMIN (GLUCOPHAGE) 500 MG tablet Take 500-1,000 mg by mouth 2 (two) times daily. 1 tablet in the morning and 2 tablets in the evening.  0  . Multiple Vitamin (MULTIVITAMIN WITH MINERALS) TABS tablet Take 1 tablet by mouth daily.    Greg Oneill omeprazole (PRILOSEC) 40 MG capsule Take 40 mg by mouth daily.  0  . naproxen (NAPROSYN) 500 MG tablet Take 1 tablet (500 mg total) by mouth 2 (two) times daily with a meal. 60 tablet 5   No current facility-administered medications for this visit.      Physical Exam  Blood pressure (!) 148/84, pulse 78, temperature 97.3 F (36.3 C), height 5\' 11"  (1.803 m), weight 242 lb (109.8 kg).  Constitutional: overall normal hygiene, normal nutrition, well developed, normal grooming, normal body habitus. Assistive device:none  Musculoskeletal: gait and station Limp none, muscle tone and strength are normal, no tremors or atrophy is present.  .  Neurological: coordination overall normal.  Deep tendon reflex/nerve stretch intact.  Sensation normal.  Cranial nerves II-XII intact.   Skin:   Normal overall no scars, lesions, ulcers or rashes. No psoriasis.  Psychiatric: Alert and oriented x 3.  Recent memory intact, remote memory unclear.  Normal mood and affect. Well groomed.  Good eye contact.  Cardiovascular: overall no swelling, no varicosities, no edema bilaterally, normal temperatures of the legs and arms, no clubbing, cyanosis and good capillary refill.  Lymphatic: palpation is normal.  The left lower leg is negative.  He has no swelling.  He has a marker over the left lower distal third middle third of the tibia anteriorly that is the area of pain.  It is negative on exam.  He has full motion of the left knee and left ankle.  NV intact. Gait is normal.  X-rays were done of the left tibia and  fibula, reported separately.  The patient has been educated about the nature of the problem(s) and counseled on treatment options.  The patient appeared to understand what I have discussed and is in agreement with it.  Encounter Diagnosis  Name Primary?  Greg Oneill Arthralgia of left lower leg Yes    PLAN Call if any problems.  Precautions discussed.  Continue current medications.   Return to clinic 3 weeks  Begin Naprosyn.   Electronically Signed Sanjuana Kava, MD 11/7/20179:13 AM

## 2016-06-13 ENCOUNTER — Encounter: Payer: Self-pay | Admitting: Orthopaedic Surgery

## 2016-06-13 ENCOUNTER — Ambulatory Visit: Payer: 59 | Admitting: Orthopaedic Surgery

## 2016-10-26 ENCOUNTER — Ambulatory Visit (INDEPENDENT_AMBULATORY_CARE_PROVIDER_SITE_OTHER): Payer: 59 | Admitting: Orthopaedic Surgery

## 2016-10-26 ENCOUNTER — Ambulatory Visit (INDEPENDENT_AMBULATORY_CARE_PROVIDER_SITE_OTHER): Payer: 59

## 2016-10-26 ENCOUNTER — Encounter: Payer: Self-pay | Admitting: Orthopaedic Surgery

## 2016-10-26 VITALS — BP 173/95 | HR 86 | Temp 97.7°F | Ht 71.0 in | Wt 253.0 lb

## 2016-10-26 DIAGNOSIS — M25562 Pain in left knee: Secondary | ICD-10-CM | POA: Diagnosis not present

## 2016-10-26 DIAGNOSIS — G8929 Other chronic pain: Secondary | ICD-10-CM | POA: Diagnosis not present

## 2016-10-26 NOTE — Progress Notes (Signed)
Patient Greg Oneill A Kruschke, male DOB:[birth year], 61 y.o. TIR:[id number]  Chief Complaint  Patient presents with  . Knee Pain    left knee pain    HPI  Greg Oneill is a 61 y.o. male who has been having more and more pain of the left knee, medially.  He has some swelling and popping but no giving way.  He tried to do some jogging and the pain got worse.  He has cut back on the jogging now.  He has tried ice, rest, heat and Advil with only slight help.  He has no redness.  He has no other joint pains. HPI  Body mass index is 35.29 kg/m.  ROS  Review of Systems  HENT: Negative for congestion.   Respiratory: Negative for cough and shortness of breath.   Cardiovascular: Negative for chest pain and leg swelling.  Endocrine: Negative for cold intolerance.  Musculoskeletal: Positive for arthralgias.    Past Medical History:  Diagnosis Date  . Arthritis   . Depression   . Diabetes mellitus without complication [facility])     Past Surgical History:  Procedure Laterality Date  . INSERTION OF MESH N/A 08/10/2014   Procedure: INSERTION OF MESH;  Surgeon: Jamesetta So, MD;  Location: [facility];  Service: General;  Laterality: N/A;  . PILONIDAL CYST EXCISION  1977   Dr Marnette Burgess  . UMBILICAL HERNIA REPAIR N/A 08/10/2014   Procedure: UMBILICAL HERNIORRHAPHY ;  Surgeon: Jamesetta So, MD;  Location: [facility];  Service: General;  Laterality: N/A;    No family history on file.  Social History Social History  Substance Use Topics  . Smoking status: Never Smoker  . Smokeless tobacco: Former Systems developer  . Alcohol use No    No Known Allergies  Current Outpatient Prescriptions  Medication Sig Dispense Refill  . alfuzosin (UROXATRAL) 10 MG 24 hr tablet Take 10 mg by mouth daily with breakfast.    . escitalopram (LEXAPRO) 10 MG tablet Take 10 mg by mouth daily.    Marland Kitchen ezetimibe-simvastatin (VYTORIN) 10-20 MG per tablet Take 1 tablet by mouth daily.    Marland Kitchen lisinopril (PRINIVIL,ZESTRIL) 10 MG  tablet Take 10 mg by mouth daily.    . metFORMIN (GLUCOPHAGE) 500 MG tablet Take 500-1,000 mg by mouth 2 (two) times daily. 1 tablet in the morning and 2 tablets in the evening.  0  . Multiple Vitamin (MULTIVITAMIN WITH MINERALS) TABS tablet Take 1 tablet by mouth daily.    . naproxen (NAPROSYN) 500 MG tablet Take 1 tablet (500 mg total) by mouth 2 (two) times daily with a meal. 60 tablet 5  . omeprazole (PRILOSEC) 40 MG capsule Take 40 mg by mouth daily.  0   No current facility-administered medications for this visit.      Physical Exam  Blood pressure (!) 173/95, pulse 86, temperature 97.7 F (36.5 C), height 5\' 11"  (1.803 m), weight 253 lb (114.8 kg).  Constitutional: overall normal hygiene, normal nutrition, well developed, normal grooming, normal body habitus. Assistive device:none  Musculoskeletal: gait and station Limp left, muscle tone and strength are normal, no tremors or atrophy is present.  .  Neurological: coordination overall normal.  Deep tendon reflex/nerve stretch intact.  Sensation normal.  Cranial nerves II-XII intact.   Skin:   Normal overall no scars, lesions, ulcers or rashes. No psoriasis.  Psychiatric: Alert and oriented x 3.  Recent memory intact, remote memory unclear.  Normal mood and affect. Well groomed.  Good eye contact.  Cardiovascular:  overall no swelling, no varicosities, no edema bilaterally, normal temperatures of the legs and arms, no clubbing, cyanosis and good capillary refill.  Lymphatic: palpation is normal.  The left lower extremity is examined:  Inspection:  Thigh:  Non-tender and no defects  Knee has swelling 1+ effusion.                        Joint tenderness is present                        Patient is tender over the medial joint line  Lower Leg:  Has normal appearance and no tenderness or defects  Ankle:  Non-tender and no defects  Foot:  Non-tender and no defects Range of Motion:  Knee:  Range of motion is: 0-110                         Crepitus is  present  Ankle:  Range of motion is normal. Strength and Tone:  The left lower extremity has normal strength and tone. Stability:  Knee:  The knee is stable.  Ankle:  The ankle is stable.    The patient has been educated about the nature of the problem(s) and counseled on treatment options.  The patient appeared to understand what I have discussed and is in agreement with it.  Encounter Diagnosis  Name Primary?  . Chronic pain of left knee Yes    PLAN Call if any problems.  Precautions discussed.  Continue current medications.   PROCEDURE NOTE:  The patient requests injections of the left knee , verbal consent was obtained.  The left knee was prepped appropriately after time out was performed.   Sterile technique was observed and injection of 1 cc of Depo-Medrol 40 mg with several cc's of plain xylocaine. Anesthesia was provided by ethyl chloride and a 20-gauge needle was used to inject the knee area. The injection was tolerated well.  A band aid dressing was applied.  The patient was advised to apply ice later today and tomorrow to the injection sight as needed.  Return to clinic 1 month.  He is to resume his Naprosyn.  I am concerned about possible medial meniscus tear.  If pain continues, consider MRI.   Electronically Signed Sanjuana Kava, MD 4/12/20189:12 AM

## 2016-11-28 ENCOUNTER — Ambulatory Visit: Payer: 59 | Admitting: Orthopaedic Surgery

## 2016-11-29 ENCOUNTER — Encounter: Payer: Self-pay | Admitting: Orthopaedic Surgery

## 2016-11-29 ENCOUNTER — Ambulatory Visit (INDEPENDENT_AMBULATORY_CARE_PROVIDER_SITE_OTHER): Payer: 59 | Admitting: Orthopaedic Surgery

## 2016-11-29 VITALS — BP 142/78 | HR 98 | Temp 97.9°F | Ht 71.0 in | Wt 255.0 lb

## 2016-11-29 DIAGNOSIS — G8929 Other chronic pain: Secondary | ICD-10-CM

## 2016-11-29 DIAGNOSIS — M25562 Pain in left knee: Secondary | ICD-10-CM | POA: Diagnosis not present

## 2016-11-29 NOTE — Progress Notes (Signed)
Patient Greg Oneill, male DOB:Oct 14, 1955, 61 y.o. JOA:[id number]  Chief Complaint  Patient presents with  . Follow-up    Left knee    HPI  Greg Oneill is a 61 y.o. male who has had left knee pain.  He is much improved after the injection last time and resuming his Naprosyn.  He has also changed from jogging to swimming.  He has no swelling of his left knee and no giving way or locking. HPI  Body mass index is 35.57 kg/m.  ROS  Review of Systems  HENT: Negative for congestion.   Respiratory: Negative for cough and shortness of breath.   Cardiovascular: Negative for chest pain and leg swelling.  Endocrine: Negative for cold intolerance.  Musculoskeletal: Positive for arthralgias.    Past Medical History:  Diagnosis Date  . Arthritis   . Depression   . Diabetes mellitus without complication [facility])     Past Surgical History:  Procedure Laterality Date  . INSERTION OF MESH N/A 08/10/2014   Procedure: INSERTION OF MESH;  Surgeon: Jamesetta So, MD;  Location: [facility];  Service: General;  Laterality: N/A;  . PILONIDAL CYST EXCISION  1977   Dr Marnette Burgess  . UMBILICAL HERNIA REPAIR N/A 08/10/2014   Procedure: UMBILICAL HERNIORRHAPHY ;  Surgeon: Jamesetta So, MD;  Location: [facility];  Service: General;  Laterality: N/A;    History reviewed. No pertinent family history.  Social History Social History  Substance Use Topics  . Smoking status: Never Smoker  . Smokeless tobacco: Former Systems developer  . Alcohol use No    No Known Allergies  Current Outpatient Prescriptions  Medication Sig Dispense Refill  . alfuzosin (UROXATRAL) 10 MG 24 hr tablet Take 10 mg by mouth daily with breakfast.    . escitalopram (LEXAPRO) 10 MG tablet Take 10 mg by mouth daily.    Marland Kitchen ezetimibe-simvastatin (VYTORIN) 10-20 MG per tablet Take 1 tablet by mouth daily.    Marland Kitchen lisinopril (PRINIVIL,ZESTRIL) 10 MG tablet Take 10 mg by mouth daily.    . metFORMIN (GLUCOPHAGE) 500 MG tablet Take  500-1,000 mg by mouth 2 (two) times daily. 1 tablet in the morning and 2 tablets in the evening.  0  . Multiple Vitamin (MULTIVITAMIN WITH MINERALS) TABS tablet Take 1 tablet by mouth daily.    . naproxen (NAPROSYN) 500 MG tablet Take 1 tablet (500 mg total) by mouth 2 (two) times daily with a meal. 60 tablet 5  . omeprazole (PRILOSEC) 40 MG capsule Take 40 mg by mouth daily.  0   No current facility-administered medications for this visit.      Physical Exam  Blood pressure (!) 142/78, pulse 98, temperature 97.9 F (36.6 C), height 5\' 11"  (1.803 m), weight 255 lb (115.7 kg).  Constitutional: overall normal hygiene, normal nutrition, well developed, normal grooming, normal body habitus. Assistive device:none  Musculoskeletal: gait and station Limp none, muscle tone and strength are normal, no tremors or atrophy is present.  .  Neurological: coordination overall normal.  Deep tendon reflex/nerve stretch intact.  Sensation normal.  Cranial nerves II-XII intact.   Skin:   Normal overall no scars, lesions, ulcers or rashes. No psoriasis.  Psychiatric: Alert and oriented x 3.  Recent memory intact, remote memory unclear.  Normal mood and affect. Well groomed.  Good eye contact.  Cardiovascular: overall no swelling, no varicosities, no edema bilaterally, normal temperatures of the legs and arms, no clubbing, cyanosis and good capillary refill.  Lymphatic: palpation is normal.  Left knee has full motion, no effusion, NV intact, stable, no redness, no limp.  The patient has been educated about the nature of the problem(s) and counseled on treatment options.  The patient appeared to understand what I have discussed and is in agreement with it.  Encounter Diagnosis  Name Primary?  . Chronic pain of left knee Yes    PLAN Call if any problems.  Precautions discussed.  Continue current medications.   Return to clinic prn   Electronically Signed Sanjuana Kava, MD 5/16/20188:35  AM

## 2017-04-09 ENCOUNTER — Ambulatory Visit (INDEPENDENT_AMBULATORY_CARE_PROVIDER_SITE_OTHER): Payer: 59 | Admitting: Otolaryngology

## 2017-05-07 IMAGING — CR DG CHEST 2V
2 series · 2 of 2 positions shown · non-contrast
Comparison: None

CLINICAL DATA: Cough and sore throat

EXAM:
CHEST  2 VIEW

[view not recorded (1 of 2)]
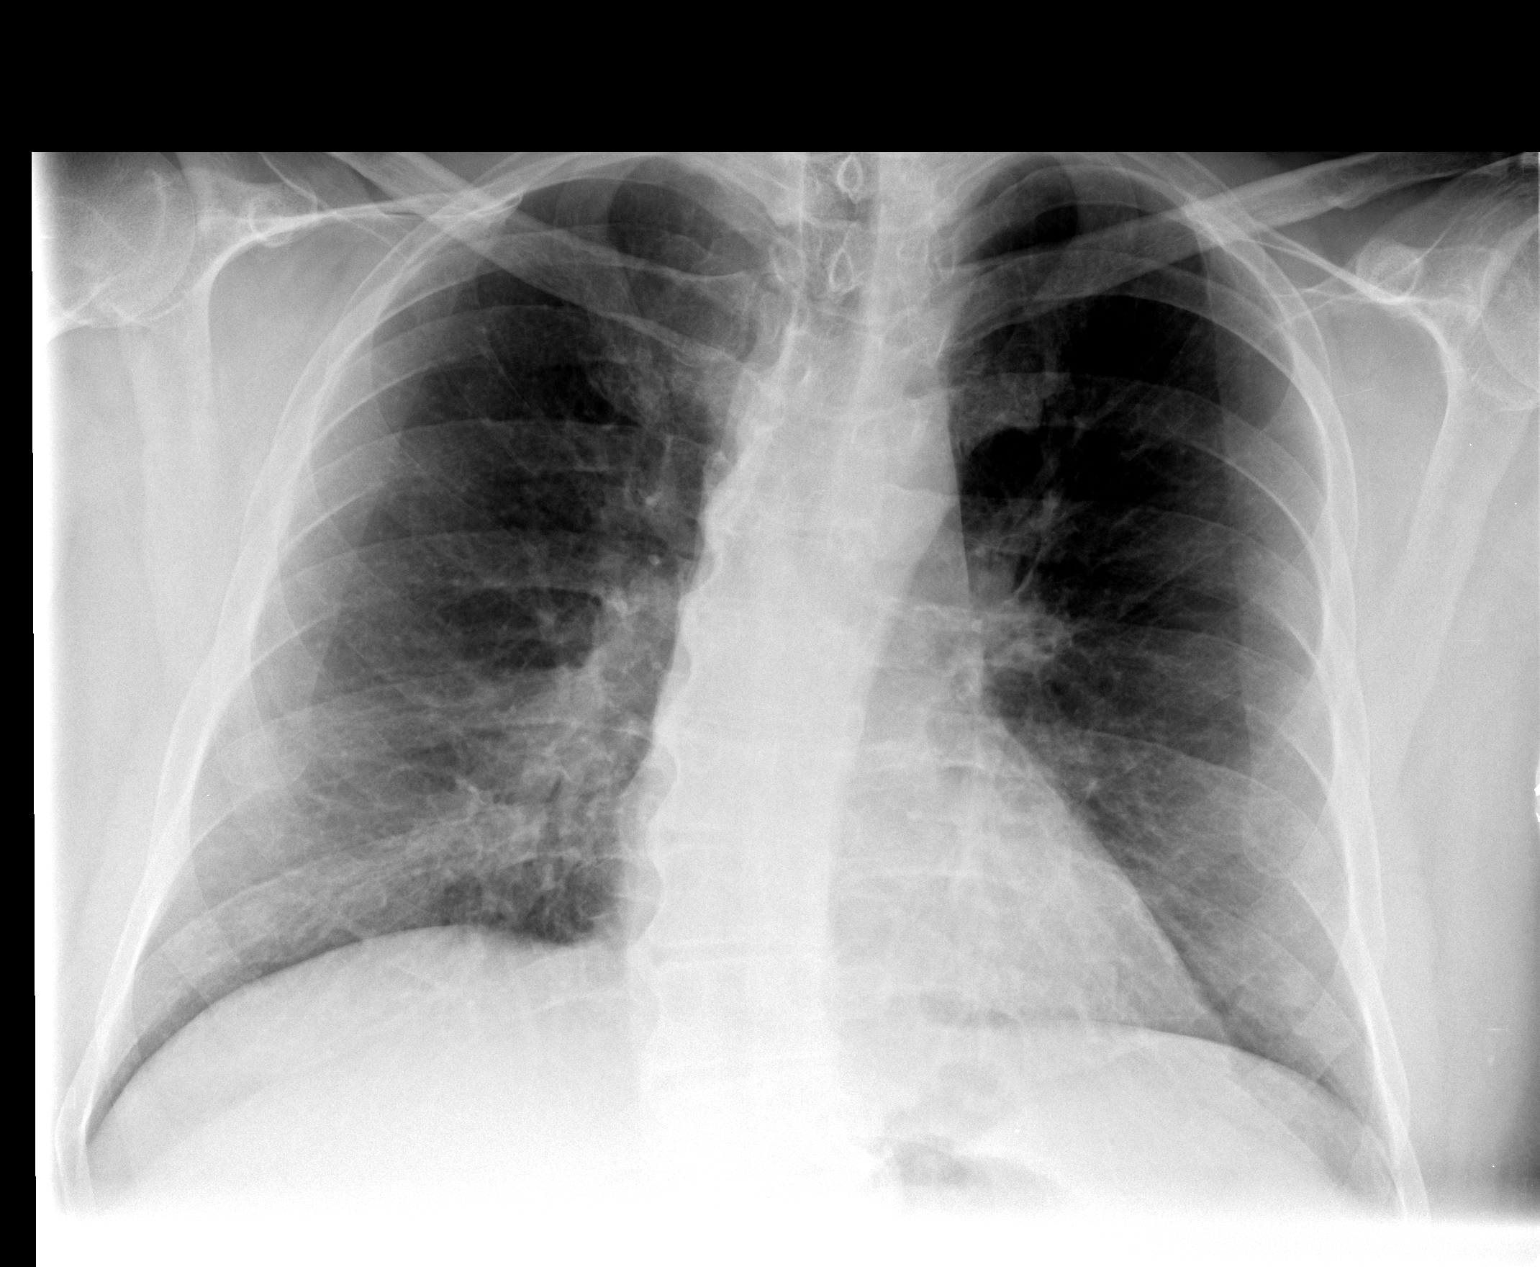

[view not recorded (2 of 2)]
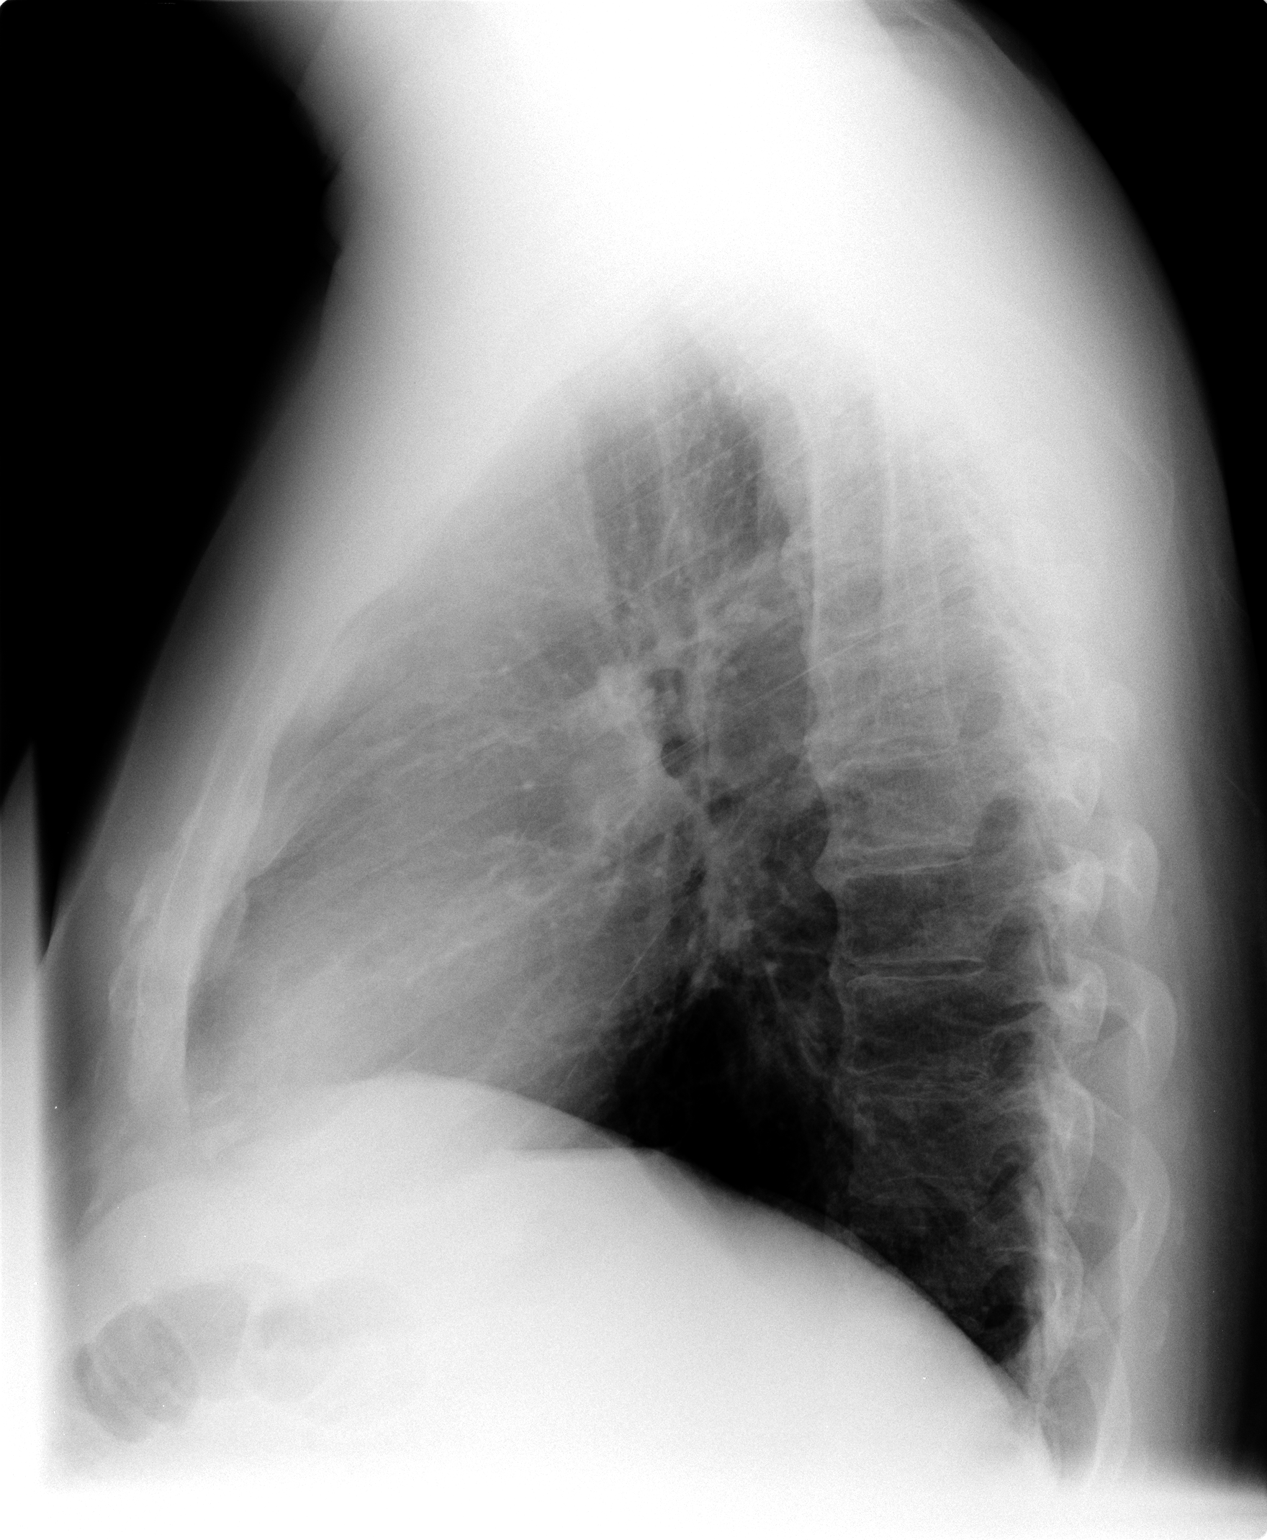

[2 of 2 positions shown; findings below may reference images not displayed]

FINDINGS: The heart size and mediastinal contours are within normal limits.
Both lungs are clear. The visualized skeletal structures are
unremarkable.
IMPRESSION: No active cardiopulmonary disease.

## 2017-07-30 ENCOUNTER — Telehealth: Payer: Self-pay | Admitting: Gastroenterology

## 2017-07-30 ENCOUNTER — Encounter: Payer: Self-pay | Admitting: Gastroenterology

## 2017-07-30 NOTE — Telephone Encounter (Signed)
Forwarding to [place] to schedule for nurse visit

## 2017-07-30 NOTE — Telephone Encounter (Signed)
TRIAGE FOR TCS, SCREENING, SUPREP.

## 2017-07-30 NOTE — Telephone Encounter (Signed)
PATIENT SCHEDULED  °

## 2017-08-22 ENCOUNTER — Ambulatory Visit (INDEPENDENT_AMBULATORY_CARE_PROVIDER_SITE_OTHER): Payer: Self-pay

## 2017-08-22 DIAGNOSIS — Z1211 Encounter for screening for malignant neoplasm of colon: Secondary | ICD-10-CM

## 2017-08-22 MED ORDER — SOD PICOSULFATE-MAG OX-CIT ACD 10-3.5-12 MG-GM-GM PO PACK: 1.0000 | PACK | Freq: Once | ORAL | 0 refills | Status: AC

## 2017-08-22 NOTE — Patient Instructions (Signed)
SPLIT-DOSING PREPOPIK INSTRUCTION SHEET  Please notify us immediately if you are diabetic, take iron supplements, or if you are on coumadin or any blood thinners.  Patient Name:  Greg Oneill Date of procedure:  09/24/17 Time to register at [place] Stay:  9:30 Provider:  Dr. Oneida Alar  Please hold the following medications: will mail a letter with this information  09/23/17  1 Day prior to procedure:     CLEAR LIQUIDS ALL DAY--NO SOLID FOODS!  MAKE SURE YOU DRINK AT LEAST 64 OUNCES OF CLEAR LIQUID PRIOR TO STARTING YOUR PREP   Diabetic Medication Instructions:  See letter    At 5:00 PM Begin the prep as follows:    1. Fill the dosing cup provided with COLD water up to the lower (5 ounces) line on the cup 2. Pour in the contents of one packet 3. STIR for 3 minutes until dissolved 4. Drink the entire contents of the cup 5. Drink five (5) 8-ounce drinks of clear liquids of your choice within the next 5 hours before going to bed  NOTHING TO EAT AFTER MIDNIGHT EXCEPT CLEAR LIQUIDS. You may take heart, blood pressure or breathing medications.   09/24/17  Day of Procedure   Diabetic Medication Instructions: see letter   5 hours before procedure @ 5:30am: Drink second dose of Prepopik 1. Fill the dosing cup provided with COLD water up to the lower (5 ounces) line on the cup 2. Pour in the contents of one packet 3. STIR for 3 minutes until dissolved 4. Drink the entire contents of the cup 5. Drink at least three (3) 8-ounce drinks of clear liquids of your choice within the next 2 hours 6. At 3 hours before your procedure time @ 7:30am: Stop drinking all liquids, nothing by mouth at this point.  You may take TYLENOL products.  Please continue your regular medications unless we have instructed you otherwise.    Please note, on the day of your procedure you MUST be accompanied by an adult who is willing to assume responsibility for you at time of discharge. If you do not  have such person with you, your procedure will have to be rescheduled.                                                                                                                     Please leave ALL jewelry at home prior to coming to the hospital for your procedure.   *It is your responsibility to check with your insurance company for the benefits of coverage you have for this procedure. Unfortunately, not all insurance companies have benefits to cover all or part of these types of procedures. It is your responsibility to check your benefits, however we will be glad to assist you with any codes your insurance company may need.   Please note that most insurance companies will not cover a screening colonoscopy for people under the age of 22  For example, with some insurance companies you may  have benefits for a screening colonoscopy, but if polyps are found the diagnosis will change and then you may have a deductible that will need to be met. Please make sure you check your benefits for screening colonoscopy as well as a diagnostic colonoscopy.  CLEAR LIQUIDS: (NO RED) Jello Apple Juice  White Grape Juice Water Banana popsicles  Kool-Aid  Coffee(No cream or milk) Tea (No cream or milk) Soft drinks Broth (fat free beef/chicken/vegetable)  Clear liquids allow you to see your fingers on the other side of the glass.  Be sure they are NOT RED in color, cloudy, but CLEAR.  Do Not Eat: Dairy products of any kind Cranberry juice Tomato or V8 Juice  Orange Juice Grapefruit Juice  Red Grape Juice Solid foods like cereal, oatmeal, yogurt, fruits, vegetables, creamed soups, eggs, bread, etc   HELPFUL HINTS TO MAKE DRINKING EASIER: -Make sure prep is extremely COLD. You may drink over ice. -Trying drinking through a straw. -Rinse mouth with water or mouthwash between glasses to remove aftertaste. -Try sipping on a cold beverage/ice popsicles between glasses of prep. -Place a piece of sugar-free  hard candy in mouth between glasses. -If you become nauseated, try consuming smaller amounts or stretch out the time between glasses.  Stop for 30 minutes to an hour & slowly start back drinking.  Call our office with any questions or concerns at [phone].  Thank You

## 2017-08-22 NOTE — Progress Notes (Addendum)
Gastroenterology Pre-Procedure Review  Request Date:08/22/17 Requesting Physician: Dr.Fields (no previous tcs)  PATIENT REVIEW QUESTIONS: The patient responded to the following health history questions as indicated:    1. Diabetes Melitis: yes insulin and metformin 2. Joint replacements in the past 12 months: no 3. Major health problems in the past 3 months: no 4. Has an artificial valve or MVP: no 5. Has a defibrillator: no 6. Has been advised in past to take antibiotics in advance of a procedure like teeth cleaning: no 7. Family history of colon cancer: no  8. Alcohol Use: no 9. History of sleep apnea: no  10. History of coronary artery or other vascular stents placed within the last 12 months: no 11. History of any prior anesthesia complications: no    MEDICATIONS & ALLERGIES:    Patient reports the following regarding taking any blood thinners:   Plavix? no Aspirin? no Coumadin? no Brilinta? no Xarelto? no Eliquis? no Pradaxa? no Savaysa? no Effient? no  Patient confirms/reports the following medications:  Current Outpatient Medications  Medication Sig Dispense Refill  . alfuzosin (UROXATRAL) 10 MG 24 hr tablet Take 10 mg by mouth daily with breakfast.    . escitalopram (LEXAPRO) 10 MG tablet Take 10 mg by mouth daily.    Marland Kitchen ezetimibe-simvastatin (VYTORIN) 10-20 MG per tablet Take 1 tablet by mouth daily.    . insulin glargine (LANTUS) 100 UNIT/ML injection Inject 22 Units into the skin at bedtime.    Marland Kitchen lisinopril (PRINIVIL,ZESTRIL) 10 MG tablet Take 10 mg by mouth daily.    . metFORMIN (GLUCOPHAGE) 500 MG tablet Take 500-1,000 mg by mouth 2 (two) times daily. 1 tablet in the morning and 2 tablets in the evening.  0   No current facility-administered medications for this visit.     Patient confirms/reports the following allergies:  No Known Allergies  No orders of the defined types were placed in this encounter.   AUTHORIZATION INFORMATION Primary Insurance: [employer],   ID #: [id number] Pre-Cert / Josem Kaufmann required: no Pre-Cert / Auth #: [id number] per Gaspar Cola INFORMATION: Procedure has been scheduled as follows:  Date: 09/24/17, Time: 10:30 Location: [facility] Dr.Fields  This Gastroenterology Pre-Precedure Review Form is being routed to the following provider(s): Neil Crouch PA-C

## 2017-08-23 NOTE — Progress Notes (Signed)
suprep was not covered by his insurance.

## 2017-08-23 NOTE — Progress Notes (Addendum)
OK to schedule.  Almyra Free,  SLF note on 07/30/17 says use Suprep. I note that Prepopik was written.   Day before procedure: lantus 11 units at bedtime, metformin 1/2 dose AM of procedure: hold lantus and metformin

## 2017-08-27 NOTE — Progress Notes (Signed)
Letter mailed to the pt. 

## 2017-09-05 ENCOUNTER — Ambulatory Visit: Payer: POS | Admitting: Orthopaedic Surgery

## 2017-09-05 ENCOUNTER — Encounter: Payer: Self-pay | Admitting: Orthopaedic Surgery

## 2017-09-05 VITALS — BP 139/71 | Temp 97.5°F | Ht 72.0 in | Wt 244.0 lb

## 2017-09-05 DIAGNOSIS — M79671 Pain in right foot: Secondary | ICD-10-CM

## 2017-09-05 DIAGNOSIS — G8929 Other chronic pain: Secondary | ICD-10-CM | POA: Diagnosis not present

## 2017-09-05 NOTE — Progress Notes (Signed)
Patient Greg Oneill A Bommarito, male DOB:October 05, 1955, 62 y.o. YYT:[id number]  Chief Complaint  Patient presents with  . Foot Pain    right x 1 week     HPI  Greg Oneill is a 62 y.o. male who has chronic heel pain on the right.  He is seeing a podiatrist for a bunion on the right foot.  He wants me to address his heel pain.  He has X-rays he brought from the podiatrist.  He wants an injection.  He has pain first thing in the morning.  After he walks, the pain goes away until he sits a while.  He is retired now and does not like the pain.  He has no redness.  He has used ice which helps.  He wants a "shot". HPI  Body mass index is 33.09 kg/m.  ROS  Review of Systems  HENT: Negative for congestion.   Respiratory: Negative for cough and shortness of breath.   Cardiovascular: Negative for chest pain and leg swelling.  Endocrine: Negative for cold intolerance.  Musculoskeletal: Positive for arthralgias.  All other systems reviewed and are negative.   Past Medical History:  Diagnosis Date  . Arthritis   . Depression   . Diabetes mellitus without complication [facility])     Past Surgical History:  Procedure Laterality Date  . INSERTION OF MESH N/A 08/10/2014   Procedure: INSERTION OF MESH;  Surgeon: Jamesetta So, MD;  Location: [facility];  Service: General;  Laterality: N/A;  . PILONIDAL CYST EXCISION  1977   Dr Marnette Burgess  . UMBILICAL HERNIA REPAIR N/A 08/10/2014   Procedure: UMBILICAL HERNIORRHAPHY ;  Surgeon: Jamesetta So, MD;  Location: [facility];  Service: General;  Laterality: N/A;    History reviewed. No pertinent family history.  Social History Social History   Tobacco Use  . Smoking status: Never Smoker  . Smokeless tobacco: Former Network engineer Use Topics  . Alcohol use: No  . Drug use: No    No Known Allergies  Current Outpatient Medications  Medication Sig Dispense Refill  . alfuzosin (UROXATRAL) 10 MG 24 hr tablet Take 10 mg by mouth daily with  breakfast.    . escitalopram (LEXAPRO) 10 MG tablet Take 10 mg by mouth daily.    Marland Kitchen ezetimibe-simvastatin (VYTORIN) 10-20 MG per tablet Take 1 tablet by mouth daily.    . insulin glargine (LANTUS) 100 UNIT/ML injection Inject 22 Units into the skin at bedtime.    Marland Kitchen lisinopril (PRINIVIL,ZESTRIL) 10 MG tablet Take 10 mg by mouth daily.    . metFORMIN (GLUCOPHAGE) 500 MG tablet Take 500-1,000 mg by mouth 2 (two) times daily. 1 tablet in the morning and 2 tablets in the evening.  0   No current facility-administered medications for this visit.      Physical Exam  Blood pressure 139/71, temperature (!) 97.5 F (36.4 C), height 6' (1.829 m), weight 244 lb (110.7 kg).  Constitutional: overall normal hygiene, normal nutrition, well developed, normal grooming, normal body habitus. Assistive device:none  Musculoskeletal: gait and station Limp right, muscle tone and strength are normal, no tremors or atrophy is present.  .  Neurological: coordination overall normal.  Deep tendon reflex/nerve stretch intact.  Sensation normal.  Cranial nerves II-XII intact.   Skin:   Normal overall no scars, lesions, ulcers or rashes. No psoriasis.  Psychiatric: Alert and oriented x 3.  Recent memory intact, remote memory unclear.  Normal mood and affect. Well groomed.  Good eye contact.  Cardiovascular: overall no swelling, no varicosities, no edema bilaterally, normal temperatures of the legs and arms, no clubbing, cyanosis and good capillary refill.  Lymphatic: palpation is normal.  Right heel tender on plantar medial side. He has no redness or swelling or numbness.  ROM of the ankle is tight with only about 5 degrees of dorsiflexion, he has tight heel cords.  NV intact.  All other systems reviewed and are negative   The patient has been educated about the nature of the problem(s) and counseled on treatment options.  The patient appeared to understand what I have discussed and is in agreement with  it.  Encounter Diagnosis  Name Primary?  . Chronic heel pain, right Yes   Procedure note: After permission from the patient and sterile prep, the area of most point tenderness of the medial plantar heel was injected by sterile technique with 1% Xylocaine and 1 cc DepoMedrol 40 tolerated well.  PLAN Call if any problems.  Precautions discussed.  Continue current medications.   I have explained stretching exercises he is to do three times a day.  Return to clinic prn   Electronically Signed Sanjuana Kava, MD 2/20/201910:13 AM

## 2017-09-24 ENCOUNTER — Encounter (HOSPITAL_COMMUNITY): Payer: Self-pay | Admitting: *Deleted

## 2017-09-24 ENCOUNTER — Encounter (HOSPITAL_COMMUNITY)
Admission: RE | Disposition: A | Discharge status: Home / Self Care | Payer: Self-pay | Source: Ambulatory Visit | Attending: Gastroenterology

## 2017-09-24 ENCOUNTER — Other Ambulatory Visit: Payer: Self-pay

## 2017-09-24 ENCOUNTER — Ambulatory Visit (HOSPITAL_COMMUNITY)
Admission: RE | Admit: 2017-09-24 | Discharge: 2017-09-24 | Disposition: A | Discharge status: Home / Self Care | Payer: POS | Source: Ambulatory Visit | Attending: Gastroenterology | Admitting: Gastroenterology

## 2017-09-24 DIAGNOSIS — F329 Major depressive disorder, single episode, unspecified: Secondary | ICD-10-CM | POA: Diagnosis not present

## 2017-09-24 DIAGNOSIS — Z8052 Family history of malignant neoplasm of bladder: Secondary | ICD-10-CM | POA: Diagnosis not present

## 2017-09-24 DIAGNOSIS — Z79899 Other long term (current) drug therapy: Secondary | ICD-10-CM | POA: Diagnosis not present

## 2017-09-24 DIAGNOSIS — Z1211 Encounter for screening for malignant neoplasm of colon: Secondary | ICD-10-CM | POA: Diagnosis present

## 2017-09-24 DIAGNOSIS — K644 Residual hemorrhoidal skin tags: Secondary | ICD-10-CM | POA: Insufficient documentation

## 2017-09-24 DIAGNOSIS — K648 Other hemorrhoids: Secondary | ICD-10-CM | POA: Insufficient documentation

## 2017-09-24 DIAGNOSIS — Z7984 Long term (current) use of oral hypoglycemic drugs: Secondary | ICD-10-CM | POA: Diagnosis not present

## 2017-09-24 DIAGNOSIS — E119 Type 2 diabetes mellitus without complications: Secondary | ICD-10-CM | POA: Diagnosis not present

## 2017-09-24 DIAGNOSIS — D122 Benign neoplasm of ascending colon: Secondary | ICD-10-CM | POA: Insufficient documentation

## 2017-09-24 DIAGNOSIS — M1990 Unspecified osteoarthritis, unspecified site: Secondary | ICD-10-CM | POA: Diagnosis not present

## 2017-09-24 HISTORY — PX: COLONOSCOPY: SHX5424

## 2017-09-24 HISTORY — PX: POLYPECTOMY: SHX5525

## 2017-09-24 LAB — GLUCOSE, CAPILLARY: GLUCOSE-CAPILLARY: 98 mg/dL (ref 65–99)

## 2017-09-24 SURGERY — COLONOSCOPY
Anesthesia: Moderate Sedation

## 2017-09-24 MED ORDER — MIDAZOLAM HCL 5 MG/5ML IJ SOLN
INTRAMUSCULAR | Status: DC | PRN
  Administered 2017-09-24 (×2): 2 mg via INTRAVENOUS

## 2017-09-24 MED ORDER — SODIUM CHLORIDE 0.9 % IV SOLN
INTRAVENOUS | Status: DC
  Administered 2017-09-24: 10:00:00 via INTRAVENOUS

## 2017-09-24 MED ORDER — STERILE WATER FOR IRRIGATION IR SOLN
Status: DC | PRN
  Administered 2017-09-24: 11:00:00

## 2017-09-24 MED ORDER — MEPERIDINE HCL 100 MG/ML IJ SOLN
INTRAMUSCULAR | Status: AC
  Filled 2017-09-24: qty 2

## 2017-09-24 MED ORDER — MIDAZOLAM HCL 5 MG/5ML IJ SOLN
INTRAMUSCULAR | Status: AC
  Filled 2017-09-24: qty 10

## 2017-09-24 MED ORDER — MEPERIDINE HCL 100 MG/ML IJ SOLN
INTRAMUSCULAR | Status: DC | PRN
  Administered 2017-09-24: 25 mg via INTRAVENOUS
  Administered 2017-09-24: 50 mg via INTRAVENOUS

## 2017-09-24 NOTE — Op Note (Signed)
[facility] Patient Name: Greg Oneill Procedure Date: 09/24/2017 10:42 AM MRN: [id number] Date of Birth: 19-Oct-1955 Attending MD: Barney Drain MD, MD CSN: [id number] Age: 62 Admit Type: Outpatient Procedure:                Colonoscopy WITH COLD SNARE POLYPECTOMY Indications:              Screening for colorectal malignant neoplasm Providers:                Barney Drain MD, MD, Otis Peak B. Sharon Seller, RN,                            Randa Spike, Technician Referring MD:             Edwinna Areola. [place] Crane MD Medicines:                Meperidine 75 mg IV, Midazolam 4 mg IV Complications:            No immediate complications. Estimated Blood Loss:     Estimated blood loss was minimal. Procedure:                Pre-Anesthesia Assessment:                           - Prior to the procedure, a History and Physical                            was performed, and patient medications and                            allergies were reviewed. The patient's tolerance of                            previous anesthesia was also reviewed. The risks                            and benefits of the procedure and the sedation                            options and risks were discussed with the patient.                            All questions were answered, and informed consent                            was obtained. Prior Anticoagulants: The patient has                            taken no previous anticoagulant or antiplatelet                            agents. ASA Grade Assessment: II - A patient with                            mild systemic disease. After reviewing the risks  and benefits, the patient was deemed in                            satisfactory condition to undergo the procedure.                            After obtaining informed consent, the colonoscope                            was passed under direct vision. Throughout the                            procedure, the  patient's blood pressure, pulse, and                            oxygen saturations were monitored continuously. The                            EC-[id number] ([id number]) scope was introduced through                            the anus and advanced to the the cecum, identified                            by appendiceal orifice and ileocecal valve. The                            colonoscopy was somewhat difficult due to a                            tortuous colon. Successful completion of the                            procedure was aided by straightening and shortening                            the scope to obtain bowel loop reduction and                            COLOWRAP. The patient tolerated the procedure well.                            The quality of the bowel preparation was good. The                            ileocecal valve, appendiceal orifice, and rectum                            were photographed. Scope In: 11:09:57 AM Scope Out: 11:25:05 AM Scope Withdrawal Time: 0 hours 10 minutes 47 seconds  Total Procedure Duration: 0 hours 15 minutes 8 seconds  Findings:      A 6 mm polyp was found in the proximal ascending colon. The polyp was  sessile. The polyp was removed with a cold snare. Resection and       retrieval were complete.      External and internal hemorrhoids were found during retroflexion. The       hemorrhoids were moderate. Impression:               - One 6 mm polyp in the proximal ascending colon,                            removed with a cold snare. Resected and retrieved.                           - External and internal hemorrhoids. Moderate Sedation:      Moderate (conscious) sedation was administered by the endoscopy nurse       and supervised by the endoscopist. The following parameters were       monitored: oxygen saturation, heart rate, blood pressure, and response       to care. Total physician intraservice time was 29 minutes. Recommendation:           -  Repeat colonoscopy in 5-10 years for surveillance.                           - Continue present medications.                           - Await pathology results.                           - High fiber diet.                           - Patient has a contact number available for                            emergencies. The signs and symptoms of potential                            delayed complications were discussed with the                            patient. Return to normal activities tomorrow.                            Written discharge instructions were provided to the                            patient. Procedure Code(s):        --- Professional ---                           [phone], Colonoscopy, flexible; with removal of                            tumor(s), polyp(s), or other lesion(s) by snare  technique                           [phone], Moderate sedation services provided by the                            same physician or other qualified health care                            professional performing the diagnostic or                            therapeutic service that the sedation supports,                            requiring the presence of an independent trained                            observer to assist in the monitoring of the                            patient's level of consciousness and physiological                            status; initial 15 minutes of intraservice time,                            patient age 84 years or older                           [phone], Moderate sedation services; each additional                            15 minutes intraservice time Diagnosis Code(s):        --- Professional ---                           Z12.11, Encounter for screening for malignant                            neoplasm of colon                           D12.2, Benign neoplasm of ascending colon                           K64.8, Other hemorrhoids CPT  copyright 2016 [employer] Association. All rights reserved. The codes documented in this report are preliminary and upon coder review may  be revised to meet current compliance requirements. Barney Drain, MD Barney Drain MD, MD 09/24/2017 11:45:24 AM This report has been signed electronically. Number of Addenda: 0

## 2017-09-24 NOTE — Discharge Instructions (Signed)
You had 1 polyp removed. You have MODERATE internal AND EXTERNAL hemorrhoids.   DRINK WATER TO KEEP YOUR URINE LIGHT YELLOW.  FOLLOW A HIGH FIBER DIET. AVOID ITEMS THAT CAUSE BLOATING & GAS. SEE INFO BELOW.  CONTINUE YOUR WEIGHT LOSS EFFORTS.  WHILE I DO NOT WANT TO ALARM YOU, YOUR BODY MASS INDEX IS OVER 30 WHICH MEANS YOU ARE OBESE. OBESITY IS ASSOCIATED WITH AN INCREASE RISK FOR ALL CANCERS, INCLUDING ESOPHAGEAL AND COLON CANCER. A WEIGHT OF 215 LBS OR LESS WILL KEEP YOUR BODY MASS INDEX(BMI) UNDER 30.  YOUR BIOPSY RESULTS WILL BE AVAILABLE IN 7 days.  Next colonoscopy between 2024 and 2026.    Colonoscopy Care After Read the instructions outlined below and refer to this sheet in the next week. These discharge instructions provide you with general information on caring for yourself after you leave the hospital. While your treatment has been planned according to the most current medical practices available, unavoidable complications occasionally occur. If you have any problems or questions after discharge, call DR. Para Cossey, [phone].  ACTIVITY  You may resume your regular activity, but move at a slower pace for the next 24 hours.   Take frequent rest periods for the next 24 hours.   Walking will help get rid of the air and reduce the bloated feeling in your belly (abdomen).   No driving for 24 hours (because of the medicine (anesthesia) used during the test).   You may shower.   Do not sign any important legal documents or operate any machinery for 24 hours (because of the anesthesia used during the test).    NUTRITION  Drink plenty of fluids.   You may resume your normal diet as instructed by your doctor.   Begin with a light meal and progress to your normal diet. Heavy or fried foods are harder to digest and may make you feel sick to your stomach (nauseated).   Avoid alcoholic beverages for 24 hours or as instructed.    MEDICATIONS  You may resume your normal  medications.   WHAT YOU CAN EXPECT TODAY  Some feelings of bloating in the abdomen.   Passage of more gas than usual.   Spotting of blood in your stool or on the toilet paper  .  IF YOU HAD POLYPS REMOVED DURING THE COLONOSCOPY:  Eat a soft diet IF YOU HAVE NAUSEA, BLOATING, ABDOMINAL PAIN, OR VOMITING.    FINDING OUT THE RESULTS OF YOUR TEST Not all test results are available during your visit. DR. Oneida Alar WILL CALL YOU WITHIN 14 DAYS OF YOUR PROCEDUE WITH YOUR RESULTS. Do not assume everything is normal if you have not heard from DR. Benetta Maclaren, CALL HER OFFICE AT [phone].  SEEK IMMEDIATE MEDICAL ATTENTION AND CALL THE OFFICE: [phone] IF:  You have more than a spotting of blood in your stool.   Your belly is swollen (abdominal distention).   You are nauseated or vomiting.   You have a temperature over 101F.   You have abdominal pain or discomfort that is severe or gets worse throughout the day.   High-Fiber Diet A high-fiber diet changes your normal diet to include more whole grains, legumes, fruits, and vegetables. Changes in the diet involve replacing refined carbohydrates with unrefined foods. The calorie level of the diet is essentially unchanged. The Dietary Reference Intake (recommended amount) for adult males is 38 grams per day. For adult females, it is 25 grams per day. Pregnant and lactating women should consume 28 grams of fiber  per day. Fiber is the intact part of a plant that is not broken down during digestion. Functional fiber is fiber that has been isolated from the plant to provide a beneficial effect in the body. PURPOSE  Increase stool bulk.   Ease and regulate bowel movements.   Lower cholesterol.   REDUCE RISK OF COLON CANCER  INDICATIONS THAT YOU NEED MORE FIBER  Constipation and hemorrhoids.   Uncomplicated diverticulosis (intestine condition) and irritable bowel syndrome.   Weight management.   As a protective measure against  hardening of the arteries (atherosclerosis), diabetes, and cancer.   GUIDELINES FOR INCREASING FIBER IN THE DIET  Start adding fiber to the diet slowly. A gradual increase of about 5 more grams (2 slices of whole-wheat bread, 2 servings of most fruits or vegetables, or 1 bowl of high-fiber cereal) per day is best. Too rapid an increase in fiber may result in constipation, flatulence, and bloating.   Drink enough water and fluids to keep your urine clear or pale yellow. Water, juice, or caffeine-free drinks are recommended. Not drinking enough fluid may cause constipation.   Eat a variety of high-fiber foods rather than one type of fiber.   Try to increase your intake of fiber through using high-fiber foods rather than fiber pills or supplements that contain small amounts of fiber.   The goal is to change the types of food eaten. Do not supplement your present diet with high-fiber foods, but replace foods in your present diet.   INCLUDE A VARIETY OF FIBER SOURCES  Replace refined and processed grains with whole grains, canned fruits with fresh fruits, and incorporate other fiber sources. White rice, white breads, and most bakery goods contain little or no fiber.   Brown whole-grain rice, buckwheat oats, and many fruits and vegetables are all good sources of fiber. These include: broccoli, Brussels sprouts, cabbage, cauliflower, beets, sweet potatoes, white potatoes (skin on), carrots, tomatoes, eggplant, squash, berries, fresh fruits, and dried fruits.   Cereals appear to be the richest source of fiber. Cereal fiber is found in whole grains and bran. Bran is the fiber-rich outer coat of cereal grain, which is largely removed in refining. In whole-grain cereals, the bran remains. In breakfast cereals, the largest amount of fiber is found in those with "bran" in their names. The fiber content is sometimes indicated on the label.   You may need to include additional fruits and vegetables each day.     In baking, for 1 cup white flour, you may use the following substitutions:   1 cup whole-wheat flour minus 2 tablespoons.   1/2 cup white flour plus 1/2 cup whole-wheat flour.   Polyps, Colon  A polyp is extra tissue that grows inside your body. Colon polyps grow in the large intestine. The large intestine, also called the colon, is part of your digestive system. It is a long, hollow tube at the end of your digestive tract where your body makes and stores stool. Most polyps are not dangerous. They are benign. This means they are not cancerous. But over time, some types of polyps can turn into cancer. Polyps that are smaller than a pea are usually not harmful. But larger polyps could someday become or may already be cancerous. To be safe, doctors remove all polyps and test them.   WHO GETS POLYPS? Anyone can get polyps, but certain people are more likely than others. You may have a greater chance of getting polyps if:  You are over 50.  You have had polyps before.   Someone in your family has had polyps.   Someone in your family has had cancer of the large intestine.   Find out if someone in your family has had polyps. You may also be more likely to get polyps if you:   Eat a lot of fatty foods   Smoke   Drink alcohol   Do not exercise  Eat too much   PREVENTION There is not one sure way to prevent polyps. You might be able to lower your risk of getting them if you:  Eat more fruits and vegetables and less fatty food.   Do not smoke.   Avoid alcohol.   Exercise every day.   Lose weight if you are overweight.   Eating more calcium and folate can also lower your risk of getting polyps. Some foods that are rich in calcium are milk, cheese, and broccoli. Some foods that are rich in folate are chickpeas, kidney beans, and spinach.   Hemorrhoids Hemorrhoids are dilated (enlarged) veins around the rectum. Sometimes clots will form in the veins. This makes them swollen and  painful. These are called thrombosed hemorrhoids. Causes of hemorrhoids include:  Constipation.   Straining to have a bowel movement.   HEAVY LIFTING  HOME CARE INSTRUCTIONS  Eat a well balanced diet and drink 6 to 8 glasses of water every day to avoid constipation. You may also use a bulk laxative.   Avoid straining to have bowel movements.   Keep anal area dry and clean.   Do not use a donut shaped pillow or sit on the toilet for long periods. This increases blood pooling and pain.   Move your bowels when your body has the urge; this will require less straining and will decrease pain and pressure.

## 2017-09-24 NOTE — H&P (Signed)
Primary Care Physician:  Celene Squibb, MD Primary Gastroenterologist:  Dr. Oneida Alar  Pre-Procedure History & Physical: HPI:  Greg Oneill is a 62 y.o. male here for [place].  Past Medical History:  Diagnosis Date  . Arthritis   . Depression   . Diabetes mellitus without complication [facility])     Past Surgical History:  Procedure Laterality Date  . INSERTION OF MESH N/A 08/10/2014   Procedure: INSERTION OF MESH;  Surgeon: Jamesetta So, MD;  Location: [facility];  Service: General;  Laterality: N/A;  . PILONIDAL CYST EXCISION  1977   Dr Marnette Burgess  . UMBILICAL HERNIA REPAIR N/A 08/10/2014   Procedure: UMBILICAL HERNIORRHAPHY ;  Surgeon: Jamesetta So, MD;  Location: [facility];  Service: General;  Laterality: N/A;    Prior to Admission medications   Medication Sig Start Date End Date Taking? Authorizing Provider  alfuzosin (UROXATRAL) 10 MG 24 hr tablet Take 10 mg by mouth daily with breakfast.   Yes [provider]  CIALIS 10 MG tablet Take 10 mg by mouth daily as needed for erectile dysfunction. 07/18/17  Yes [provider]  ciclopirox (PENLAC) 8 % solution Apply 1 application topically daily as needed (fungus).  08/13/17  Yes [provider]  DRYSOL 20 % external solution Apply 1 application topically daily as needed (moist skin).  08/14/17  Yes [provider]  escitalopram (LEXAPRO) 10 MG tablet Take 10 mg by mouth daily.   Yes [provider]  ezetimibe-simvastatin (VYTORIN) 10-20 MG per tablet Take 1 tablet by mouth daily.   Yes [provider]  lisinopril (PRINIVIL,ZESTRIL) 10 MG tablet Take 10 mg by mouth daily.   Yes [provider]  metFORMIN (GLUCOPHAGE) 500 MG tablet Take 500 mg by mouth 2 (two) times daily.  08/05/14  Yes [provider]  TOUJEO SOLOSTAR 300 UNIT/ML SOPN Inject 22 Units as directed at bedtime. 07/03/17  Yes [provider]    Allergies as of 08/22/2017  . (No Known  Allergies)    Family History  Problem Relation Age of Onset  . Bladder Cancer Father   . Parkinson's disease Brother   . Colon cancer Neg Hx   . Colon polyps Neg Hx     Social History   Socioeconomic History  . Marital status: Married    Spouse name: Not on file  . Number of children: Not on file  . Years of education: Not on file  . Highest education level: Not on file  Social Needs  . Financial resource strain: Not on file  . Food insecurity - worry: Not on file  . Food insecurity - inability: Not on file  . Transportation needs - medical: Not on file  . Transportation needs - non-medical: Not on file  Occupational History  . Not on file  Tobacco Use  . Smoking status: Never Smoker  . Smokeless tobacco: Former Network engineer and Sexual Activity  . Alcohol use: No  . Drug use: No  . Sexual activity: Not on file  Other Topics Concern  . Not on file  Social History Narrative  . Not on file    Review of Systems: See HPI, otherwise negative ROS   Physical Exam: BP 126/68   Pulse 60   Temp 98.4 F (36.9 C) (Oral)   Resp 13   Ht 6' (1.829 m)   Wt 237 lb (107.5 kg)   SpO2 99%   BMI 32.14 kg/m  General:  Alert,  pleasant and cooperative in NAD Head:  Normocephalic and atraumatic. Neck:  Supple; Lungs:  Clear throughout to auscultation.    Heart:  Regular rate and rhythm. Abdomen:  Soft, nontender and nondistended. Normal bowel sounds, without guarding, and without rebound.   Neurologic:  Alert and  oriented x4;  grossly normal neurologically.  Impression/Plan:     SCREENING  Plan:  1. TCS TODAY DISCUSSED PROCEDURE, BENEFITS, & RISKS: < 1% chance of medication reaction, bleeding, perforation, or rupture of spleen/liver.

## 2017-09-25 ENCOUNTER — Telehealth: Payer: Self-pay | Admitting: Gastroenterology

## 2017-09-25 NOTE — Telephone Encounter (Signed)
Please call pt. He had A simple adenoma removed from hIS colon.    DRINK WATER TO KEEP YOUR URINE LIGHT YELLOW. FOLLOW A HIGH FIBER DIET. AVOID ITEMS THAT CAUSE BLOATING & GAS.  CONTINUE YOUR WEIGHT LOSS EFFORTS.  A WEIGHT OF 215 LBS OR LESS WILL KEEP YOUR BODY MASS INDEX(BMI) UNDER 30. Next colonoscopy between 2024 and 2026.

## 2017-09-25 NOTE — Telephone Encounter (Signed)
REMINDER IN EPIC °

## 2017-09-25 NOTE — Telephone Encounter (Signed)
Pt is aware.  

## 2017-09-25 NOTE — Telephone Encounter (Signed)
LMOM to call.

## 2017-09-28 ENCOUNTER — Encounter (HOSPITAL_COMMUNITY): Payer: Self-pay | Admitting: Gastroenterology

## 2019-01-20 ENCOUNTER — Ambulatory Visit: Payer: Self-pay | Admitting: Surgery

## 2019-01-20 NOTE — H&P (View-Only) (Signed)
Greg Oneill Documented: 01/20/2019 1:56 PM Location: [facility] Patient #: [id number] DOB: 1955/08/26 Married / Language: [name] / Race: White Male  History of Present Illness Greg Oneill A. Greg Morriss MD; 01/20/2019 2:15 PM) Patient words: Patient sent at the request Dr. Nevada Crane for a cyst on his posterior neck and upper back. Present for many years to the [facility] next sitting larger causing more pain and discomfort. He's had no fever or chills. The cyst on his upper back between his scapula and measures about a centimeter maximum diameter. There is no drainage or redness.  The patient is a 63 year old male.   Past Surgical History [name], [place]; 01/20/2019 1:57 PM) Colon Polyp Removal - Colonoscopy  Diagnostic Studies History [name], [place]; 01/20/2019 1:57 PM) Colonoscopy 1-5 years ago  Allergies [name], CMA; 01/20/2019 1:58 PM) No Known Drug Allergies [01/20/2019]: Allergies Reconciled  Medication History [name], [place]; 01/20/2019 2:01 PM) Ezetimibe-Simvastatin (10-10MG  Tablet, Oral) Active. Lisinopril (10MG  Tablet, Oral) Active. Terbinafine (1% Cream, External) Active. buPROPion HCl (75MG  Tablet, Oral) Active. metFORMIN HCl (500MG  Tablet, Oral) Active. Medications Reconciled  Social History [name], [place]; 01/20/2019 1:57 PM) Alcohol use Remotely quit alcohol use. Caffeine use Carbonated beverages, Tea. Illicit drug use Remotely quit drug use. Tobacco use Former smoker.  Family History [name], [place]; 01/20/2019 1:57 PM) Alcohol Abuse Brother, Father. Cancer Father.  Other Problems [name], [place]; 01/20/2019 1:57 PM) Arthritis Back Pain Diabetes Mellitus     Review of Systems [name] CMA; 01/20/2019 1:57 PM) General Not Present- Appetite Loss, Chills, Fatigue, Fever, Night Sweats, Weight Gain and Weight Loss. Skin Not Present- Change in Wart/Mole, Dryness, Hives, Jaundice, New Lesions,  Non-Healing Wounds, Rash and Ulcer. HEENT Present- Hearing Loss and Ringing in the Ears. Not Present- Earache, Hoarseness, Nose Bleed, Oral Ulcers, Seasonal Allergies, Sinus Pain, Sore Throat, Visual Disturbances, Wears glasses/contact lenses and Yellow Eyes. Respiratory Not Present- Bloody sputum, Chronic Cough, Difficulty Breathing, Snoring and Wheezing. Breast Not Present- Breast Mass, Breast Pain, Nipple Discharge and Skin Changes. Cardiovascular Not Present- Chest Pain, Difficulty Breathing Lying Down, Leg Cramps, Palpitations, Rapid Heart Rate, Shortness of Breath and Swelling of Extremities. Gastrointestinal Not Present- Abdominal Pain, Bloating, Bloody Stool, Change in Bowel Habits, Chronic diarrhea, Constipation, Difficulty Swallowing, Excessive gas, Gets full quickly at meals, Hemorrhoids, Indigestion, Nausea, Rectal Pain and Vomiting. Male Genitourinary Not Present- Blood in Urine, Change in Urinary Stream, Frequency, Impotence, Nocturia, Painful Urination, Urgency and Urine Leakage. Musculoskeletal Present- Back Pain and Joint Pain. Not Present- Joint Stiffness, Muscle Pain, Muscle Weakness and Swelling of Extremities. Neurological Not Present- Decreased Memory, Fainting, Headaches, Numbness, Seizures, Tingling, Tremor, Trouble walking and Weakness. Psychiatric Present- Depression. Not Present- Anxiety, Bipolar, Change in Sleep Pattern, Fearful and Frequent crying.  Vitals [name] CMA; 01/20/2019 1:58 PM) 01/20/2019 1:57 PM Weight: 257.6 lb Height: 72in Body Surface Area: 2.37 m Body Mass Index: 34.94 kg/m  Temp.: 98.17F  Pulse: 89 (Regular)  BP: 142/80 (Sitting, Left Arm, Standard)        Physical Exam (Tawna Alwin A. Richrd Kuzniar MD; 01/20/2019 2:15 PM)  General Mental Status-Alert. General Appearance-Consistent with stated age. Hydration-Well hydrated. Voice-Normal.  Integumentary Note: Operative 5 cm mobile subcutaneous sebaceous cyst. Signs infection  or drainage. In the upper central back which we scapula was 1 segment of mobile sebaceous cyst  Eye Eyeball - Bilateral-Extraocular movements intact. Sclera/Conjunctiva - Bilateral-No scleral icterus.  Chest and Lung Exam Chest and lung exam reveals -quiet, even and easy respiratory effort with no  use of accessory muscles and on auscultation, normal breath sounds, no adventitious sounds and normal vocal resonance. Inspection Chest Wall - Normal. Back - normal.  Cardiovascular Cardiovascular examination reveals -normal heart sounds, regular rate and rhythm with no murmurs and normal pedal pulses bilaterally.  Neurologic Neurologic evaluation reveals -alert and oriented x 3 with no impairment of recent or remote memory. Mental Status-Normal.  Musculoskeletal Normal Exam - Left-Upper Extremity Strength Normal and Lower Extremity Strength Normal. Normal Exam - Right-Upper Extremity Strength Normal and Lower Extremity Strength Normal.    Assessment & Plan (Lowella Kindley A. Druscilla Petsch MD; 01/20/2019 2:16 PM)  SEBACEOUS CYST (L72.3) Impression: 5 cm posterior neck and 1 cm between scapula  Recommend excision due to pain and discomfort. Risks, benefits and other treatment options were discussed today. Risks of surgery include but not exclusive of bleeding, infection, nerve injury, globus, blood clot, stroke, cardiovascular event, anesthesia risk, poor wound healing, cosmetic deformity, and wound care issues. He agrees to proceed.  Current Plans Pt Education - [facility] Free Text Education/Instructions: discussed with patient and provided information. The pathophysiology of skin & subcutaneous masses was discussed. Natural history risks without surgery were discussed. I recommended surgery to remove the mass. I explained the technique of removal with use of local anesthesia & possible need for more aggressive sedation/anesthesia for patient comfort.  Risks such as bleeding, infection,  wound breakdown, heart attack, death, and other risks were discussed. I noted a good likelihood this will help address the problem. Possibility that this will not correct all symptoms was explained. Possibility of regrowth/recurrence of the mass was discussed. We will work to minimize complications. Questions were answered. The patient expresses understanding & wishes to proceed with surgery.

## 2019-01-20 NOTE — H&P (Signed)
Greg Oneill Documented: 01/20/2019 1:56 PM Location: [facility] Patient #: [id number] DOB: 1956-01-20 Married / Language: [name] / Race: White Male  History of Present Illness Greg Moores A. Blinda Turek MD; 01/20/2019 2:15 PM) Patient words: Patient sent at the request Dr. [place] Crane for a cyst on his posterior neck and upper back. Present for many years to the [facility] next sitting larger causing more pain and discomfort. He's had no fever or chills. The cyst on his upper back between his scapula and measures about a centimeter maximum diameter. There is no drainage or redness.  The patient is a 63 year old male.   Past Surgical History [name], [place]; 01/20/2019 1:57 PM) Colon Polyp Removal - Colonoscopy  Diagnostic Studies History [name], [place]; 01/20/2019 1:57 PM) Colonoscopy 1-5 years ago  Allergies [name], CMA; 01/20/2019 1:58 PM) No Known Drug Allergies [01/20/2019]: Allergies Reconciled  Medication History [name], CMA; 01/20/2019 2:01 PM) Ezetimibe-Simvastatin (10-10MG  Tablet, Oral) Active. Lisinopril (10MG  Tablet, Oral) Active. Terbinafine (1% Cream, External) Active. buPROPion HCl (75MG  Tablet, Oral) Active. metFORMIN HCl (500MG  Tablet, Oral) Active. Medications Reconciled  Social History [name], [place]; 01/20/2019 1:57 PM) Alcohol use Remotely quit alcohol use. Caffeine use Carbonated beverages, Tea. Illicit drug use Remotely quit drug use. Tobacco use Former smoker.  Family History [name], [place]; 01/20/2019 1:57 PM) Alcohol Abuse Brother, Father. Cancer Father.  Other Problems [name], [place]; 01/20/2019 1:57 PM) Arthritis Back Pain Diabetes Mellitus     Review of Systems [name] CMA; 01/20/2019 1:57 PM) General Not Present- Appetite Loss, Chills, Fatigue, Fever, Night Sweats, Weight Gain and Weight Loss. Skin Not Present- Change in Wart/Mole, Dryness, Hives, Jaundice, New Lesions,  Non-Healing Wounds, Rash and Ulcer. HEENT Present- Hearing Loss and Ringing in the Ears. Not Present- Earache, Hoarseness, Nose Bleed, Oral Ulcers, Seasonal Allergies, Sinus Pain, Sore Throat, Visual Disturbances, Wears glasses/contact lenses and Yellow Eyes. Respiratory Not Present- Bloody sputum, Chronic Cough, Difficulty Breathing, Snoring and Wheezing. Breast Not Present- Breast Mass, Breast Pain, Nipple Discharge and Skin Changes. Cardiovascular Not Present- Chest Pain, Difficulty Breathing Lying Down, Leg Cramps, Palpitations, Rapid Heart Rate, Shortness of Breath and Swelling of Extremities. Gastrointestinal Not Present- Abdominal Pain, Bloating, Bloody Stool, Change in Bowel Habits, Chronic diarrhea, Constipation, Difficulty Swallowing, Excessive gas, Gets full quickly at meals, Hemorrhoids, Indigestion, Nausea, Rectal Pain and Vomiting. Male Genitourinary Not Present- Blood in Urine, Change in Urinary Stream, Frequency, Impotence, Nocturia, Painful Urination, Urgency and Urine Leakage. Musculoskeletal Present- Back Pain and Joint Pain. Not Present- Joint Stiffness, Muscle Pain, Muscle Weakness and Swelling of Extremities. Neurological Not Present- Decreased Memory, Fainting, Headaches, Numbness, Seizures, Tingling, Tremor, Trouble walking and Weakness. Psychiatric Present- Depression. Not Present- Anxiety, Bipolar, Change in Sleep Pattern, Fearful and Frequent crying.  Vitals [name] CMA; 01/20/2019 1:58 PM) 01/20/2019 1:57 PM Weight: 257.6 lb Height: 72in Body Surface Area: 2.37 m Body Mass Index: 34.94 kg/m  Temp.: 98.78F  Pulse: 89 (Regular)  BP: 142/80 (Sitting, Left Arm, Standard)        Physical Exam ([name] MD; 01/20/2019 2:15 PM)  General Mental Status-Alert. General Appearance-Consistent with stated age. Hydration-Well hydrated. Voice-Normal.  Integumentary Note: Operative 5 cm mobile subcutaneous sebaceous cyst. Signs infection  or drainage. In the upper central back which we scapula was 1 segment of mobile sebaceous cyst  Eye Eyeball - Bilateral-Extraocular movements intact. Sclera/Conjunctiva - Bilateral-No scleral icterus.  Chest and Lung Exam Chest and lung exam reveals -quiet, even and easy respiratory effort with no  use of accessory muscles and on auscultation, normal breath sounds, no adventitious sounds and normal vocal resonance. Inspection Chest Wall - Normal. Back - normal.  Cardiovascular Cardiovascular examination reveals -normal heart sounds, regular rate and rhythm with no murmurs and normal pedal pulses bilaterally.  Neurologic Neurologic evaluation reveals -alert and oriented x 3 with no impairment of recent or remote memory. Mental Status-Normal.  Musculoskeletal Normal Exam - Left-Upper Extremity Strength Normal and Lower Extremity Strength Normal. Normal Exam - Right-Upper Extremity Strength Normal and Lower Extremity Strength Normal.    Assessment & Plan ([name] MD; 01/20/2019 2:16 PM)  SEBACEOUS CYST (L72.3) Impression: 5 cm posterior neck and 1 cm between scapula  Recommend excision due to pain and discomfort. Risks, benefits and other treatment options were discussed today. Risks of surgery include but not exclusive of bleeding, infection, nerve injury, globus, blood clot, stroke, cardiovascular event, anesthesia risk, poor wound healing, cosmetic deformity, and wound care issues. He agrees to proceed.  Current Plans Pt Education - [facility] Free Text Education/Instructions: discussed with patient and provided information. The pathophysiology of skin & subcutaneous masses was discussed. Natural history risks without surgery were discussed. I recommended surgery to remove the mass. I explained the technique of removal with use of local anesthesia & possible need for more aggressive sedation/anesthesia for patient comfort.  Risks such as bleeding, infection,  wound breakdown, heart attack, death, and other risks were discussed. I noted a good likelihood this will help address the problem. Possibility that this will not correct all symptoms was explained. Possibility of regrowth/recurrence of the mass was discussed. We will work to minimize complications. Questions were answered. The patient expresses understanding & wishes to proceed with surgery.

## 2019-02-11 ENCOUNTER — Other Ambulatory Visit: Payer: Self-pay

## 2019-02-11 ENCOUNTER — Encounter (HOSPITAL_BASED_OUTPATIENT_CLINIC_OR_DEPARTMENT_OTHER): Payer: Self-pay | Admitting: *Deleted

## 2019-02-14 ENCOUNTER — Encounter (HOSPITAL_BASED_OUTPATIENT_CLINIC_OR_DEPARTMENT_OTHER)
Admission: RE | Admit: 2019-02-14 | Discharge: 2019-02-14 | Disposition: A | Discharge status: Home / Self Care | Payer: POS | Source: Ambulatory Visit | Attending: Surgery | Admitting: Surgery

## 2019-02-14 ENCOUNTER — Other Ambulatory Visit: Payer: Self-pay

## 2019-02-14 ENCOUNTER — Other Ambulatory Visit (HOSPITAL_COMMUNITY)
Admission: RE | Admit: 2019-02-14 | Discharge: 2019-02-14 | Disposition: A | Discharge status: Home / Self Care | Payer: POS | Source: Ambulatory Visit | Attending: Surgery | Admitting: Surgery

## 2019-02-14 DIAGNOSIS — Z20828 Contact with and (suspected) exposure to other viral communicable diseases: Secondary | ICD-10-CM | POA: Diagnosis present

## 2019-02-14 LAB — CBC WITH DIFFERENTIAL/PLATELET
Abs Immature Granulocytes: 0.02 10*3/uL (ref 0.00–0.07)
Basophils Absolute: 0.1 10*3/uL (ref 0.0–0.1)
Basophils Relative: 1 %
Eosinophils Absolute: 0.1 10*3/uL (ref 0.0–0.5)
Eosinophils Relative: 2 %
HCT: 43.3 % (ref 39.0–52.0)
Hemoglobin: 15.1 g/dL (ref 13.0–17.0)
Immature Granulocytes: 0 %
Lymphocytes Relative: 20 %
Lymphs Abs: 1.6 10*3/uL (ref 0.7–4.0)
MCH: 31.1 pg (ref 26.0–34.0)
MCHC: 34.9 g/dL (ref 30.0–36.0)
MCV: 89.3 fL (ref 80.0–100.0)
Monocytes Absolute: 0.5 10*3/uL (ref 0.1–1.0)
Monocytes Relative: 6 %
Neutro Abs: 5.8 10*3/uL (ref 1.7–7.7)
Neutrophils Relative %: 71 %
Platelets: 194 10*3/uL (ref 150–400)
RBC: 4.85 MIL/uL (ref 4.22–5.81)
RDW: 12.5 % (ref 11.5–15.5)
WBC: 8 10*3/uL (ref 4.0–10.5)
nRBC: 0 % (ref 0.0–0.2)

## 2019-02-14 LAB — COMPREHENSIVE METABOLIC PANEL
ALT: 29 U/L (ref 0–44)
AST: 37 U/L (ref 15–41)
Albumin: 4 g/dL (ref 3.5–5.0)
Alkaline Phosphatase: 67 U/L (ref 38–126)
Anion gap: 10 (ref 5–15)
BUN: 12 mg/dL (ref 8–23)
CO2: 24 mmol/L (ref 22–32)
Calcium: 8.8 mg/dL — ABNORMAL LOW (ref 8.9–10.3)
Chloride: 100 mmol/L (ref 98–111)
Creatinine, Ser: 1.07 mg/dL (ref 0.61–1.24)
GFR calc Af Amer: >60 mL/min (ref >60)
GFR calc non Af Amer: >60 mL/min (ref >60)
Glucose, Bld: 151 mg/dL — ABNORMAL HIGH (ref 70–99)
Potassium: 4.4 mmol/L (ref 3.5–5.1)
Sodium: 134 mmol/L — ABNORMAL LOW (ref 135–145)
Total Bilirubin: 1.8 mg/dL — ABNORMAL HIGH (ref 0.3–1.2)
Total Protein: 6.5 g/dL (ref 6.5–8.1)

## 2019-02-14 LAB — SARS CORONAVIRUS 2 (TAT 6-24 HRS): SARS Coronavirus 2: NEGATIVE

## 2019-02-14 NOTE — Progress Notes (Signed)
Gatorade drink given with instructions to complete by 12 dos, pt verbalized understanding.

## 2019-02-18 ENCOUNTER — Encounter (HOSPITAL_BASED_OUTPATIENT_CLINIC_OR_DEPARTMENT_OTHER)
Admission: RE | Disposition: A | Discharge status: Home / Self Care | Payer: Self-pay | Source: Home / Self Care | Attending: Surgery

## 2019-02-18 ENCOUNTER — Ambulatory Visit (HOSPITAL_BASED_OUTPATIENT_CLINIC_OR_DEPARTMENT_OTHER): Payer: POS | Admitting: Anesthesiology

## 2019-02-18 ENCOUNTER — Encounter (HOSPITAL_BASED_OUTPATIENT_CLINIC_OR_DEPARTMENT_OTHER): Payer: Self-pay | Admitting: *Deleted

## 2019-02-18 ENCOUNTER — Ambulatory Visit (HOSPITAL_BASED_OUTPATIENT_CLINIC_OR_DEPARTMENT_OTHER)
Admission: RE | Admit: 2019-02-18 | Discharge: 2019-02-18 | Disposition: A | Discharge status: Home / Self Care | Payer: POS | Attending: Surgery | Admitting: Surgery

## 2019-02-18 DIAGNOSIS — F418 Other specified anxiety disorders: Secondary | ICD-10-CM | POA: Insufficient documentation

## 2019-02-18 DIAGNOSIS — M1990 Unspecified osteoarthritis, unspecified site: Secondary | ICD-10-CM | POA: Insufficient documentation

## 2019-02-18 DIAGNOSIS — E119 Type 2 diabetes mellitus without complications: Secondary | ICD-10-CM | POA: Insufficient documentation

## 2019-02-18 DIAGNOSIS — Z7984 Long term (current) use of oral hypoglycemic drugs: Secondary | ICD-10-CM | POA: Diagnosis not present

## 2019-02-18 DIAGNOSIS — L723 Sebaceous cyst: Secondary | ICD-10-CM | POA: Insufficient documentation

## 2019-02-18 DIAGNOSIS — Z87891 Personal history of nicotine dependence: Secondary | ICD-10-CM | POA: Diagnosis not present

## 2019-02-18 DIAGNOSIS — L720 Epidermal cyst: Secondary | ICD-10-CM | POA: Diagnosis not present

## 2019-02-18 DIAGNOSIS — Z79899 Other long term (current) drug therapy: Secondary | ICD-10-CM | POA: Diagnosis not present

## 2019-02-18 HISTORY — DX: Anxiety disorder, unspecified: F41.9

## 2019-02-18 HISTORY — PX: CYST EXCISION: SHX5701

## 2019-02-18 LAB — GLUCOSE, CAPILLARY
Glucose-Capillary: 156 mg/dL — ABNORMAL HIGH (ref 70–99)
Glucose-Capillary: 138 mg/dL — ABNORMAL HIGH (ref 70–99)

## 2019-02-18 SURGERY — CYST REMOVAL
Anesthesia: General | Site: Back

## 2019-02-18 MED ORDER — CEFAZOLIN SODIUM-DEXTROSE 2-4 GM/100ML-% IV SOLN
INTRAVENOUS | Status: AC
Start: 2019-02-18 — End: ?
  Filled 2019-02-18: qty 100

## 2019-02-18 MED ORDER — IBUPROFEN 800 MG PO TABS: 800.0000 mg | ORAL_TABLET | Freq: Three times a day (TID) | ORAL | 0 refills | Status: DC | PRN

## 2019-02-18 MED ORDER — DEXAMETHASONE SODIUM PHOSPHATE 4 MG/ML IJ SOLN
INTRAMUSCULAR | Status: DC | PRN
  Administered 2019-02-18: 4 mg via INTRAVENOUS

## 2019-02-18 MED ORDER — HYDROCODONE-ACETAMINOPHEN 5-325 MG PO TABS: 1.0000 | ORAL_TABLET | Freq: Four times a day (QID) | ORAL | 0 refills | Status: DC | PRN

## 2019-02-18 MED ORDER — HYDROMORPHONE HCL 1 MG/ML IJ SOLN: 0.2500 mg | INTRAMUSCULAR | Status: DC | PRN

## 2019-02-18 MED ORDER — MEPERIDINE HCL 25 MG/ML IJ SOLN: 6.2500 mg | INTRAMUSCULAR | Status: DC | PRN

## 2019-02-18 MED ORDER — CELECOXIB 200 MG PO CAPS
200.0000 mg | ORAL_CAPSULE | ORAL | Status: AC
  Administered 2019-02-18: 200 mg via ORAL

## 2019-02-18 MED ORDER — SCOPOLAMINE 1 MG/3DAYS TD PT72: 1.0000 | MEDICATED_PATCH | Freq: Once | TRANSDERMAL | Status: DC

## 2019-02-18 MED ORDER — CHLORHEXIDINE GLUCONATE CLOTH 2 % EX PADS: 6.0000 | MEDICATED_PAD | Freq: Once | CUTANEOUS | Status: DC

## 2019-02-18 MED ORDER — GABAPENTIN 300 MG PO CAPS
300.0000 mg | ORAL_CAPSULE | ORAL | Status: AC
  Administered 2019-02-18: 14:00:00 300 mg via ORAL

## 2019-02-18 MED ORDER — ONDANSETRON HCL 4 MG/2ML IJ SOLN
4.0000 mg | Freq: Once | INTRAMUSCULAR | Status: AC | PRN
  Administered 2019-02-18: 17:00:00 4 mg via INTRAVENOUS

## 2019-02-18 MED ORDER — PHENYLEPHRINE HCL (PRESSORS) 10 MG/ML IV SOLN
INTRAVENOUS | Status: DC | PRN
  Administered 2019-02-18 (×2): 80 ug via INTRAVENOUS

## 2019-02-18 MED ORDER — GABAPENTIN 300 MG PO CAPS
ORAL_CAPSULE | ORAL | Status: AC
  Filled 2019-02-18: qty 1

## 2019-02-18 MED ORDER — FENTANYL CITRATE (PF) 100 MCG/2ML IJ SOLN
INTRAMUSCULAR | Status: AC
  Filled 2019-02-18: qty 2

## 2019-02-18 MED ORDER — SUGAMMADEX SODIUM 200 MG/2ML IV SOLN
INTRAVENOUS | Status: DC | PRN
  Administered 2019-02-18: 200 mg via INTRAVENOUS

## 2019-02-18 MED ORDER — BUPIVACAINE-EPINEPHRINE 0.25% -1:200000 IJ SOLN
INTRAMUSCULAR | Status: DC | PRN
  Administered 2019-02-18: 15 mL

## 2019-02-18 MED ORDER — EPHEDRINE SULFATE 50 MG/ML IJ SOLN
INTRAMUSCULAR | Status: DC | PRN
  Administered 2019-02-18: 10 mg via INTRAVENOUS

## 2019-02-18 MED ORDER — ACETAMINOPHEN 500 MG PO TABS
1000.0000 mg | ORAL_TABLET | ORAL | Status: AC
  Administered 2019-02-18: 1000 mg via ORAL

## 2019-02-18 MED ORDER — CELECOXIB 200 MG PO CAPS
ORAL_CAPSULE | ORAL | Status: AC
  Filled 2019-02-18: qty 1

## 2019-02-18 MED ORDER — LACTATED RINGERS IV SOLN
INTRAVENOUS | Status: DC
  Administered 2019-02-18: 16:00:00 via INTRAVENOUS

## 2019-02-18 MED ORDER — MIDAZOLAM HCL 2 MG/2ML IJ SOLN
INTRAMUSCULAR | Status: AC
  Filled 2019-02-18: qty 2

## 2019-02-18 MED ORDER — FENTANYL CITRATE (PF) 100 MCG/2ML IJ SOLN: 50.0000 ug | INTRAMUSCULAR | Status: DC | PRN

## 2019-02-18 MED ORDER — ROCURONIUM BROMIDE 100 MG/10ML IV SOLN
INTRAVENOUS | Status: DC | PRN
  Administered 2019-02-18: 100 mg via INTRAVENOUS

## 2019-02-18 MED ORDER — LIDOCAINE 2% (20 MG/ML) 5 ML SYRINGE
INTRAMUSCULAR | Status: DC | PRN
  Administered 2019-02-18: 100 mg via INTRAVENOUS

## 2019-02-18 MED ORDER — MIDAZOLAM HCL 2 MG/2ML IJ SOLN: 1.0000 mg | INTRAMUSCULAR | Status: DC | PRN

## 2019-02-18 MED ORDER — DEXTROSE 5 % IV SOLN
3.0000 g | INTRAVENOUS | Status: AC
  Administered 2019-02-18: 2 g via INTRAVENOUS

## 2019-02-18 MED ORDER — PROPOFOL 10 MG/ML IV BOLUS
INTRAVENOUS | Status: DC | PRN
  Administered 2019-02-18: 200 mg via INTRAVENOUS

## 2019-02-18 MED ORDER — ACETAMINOPHEN 500 MG PO TABS
ORAL_TABLET | ORAL | Status: AC
  Filled 2019-02-18: qty 2

## 2019-02-18 SURGICAL SUPPLY — 45 items
BENZOIN TINCTURE PRP APPL 2/3 (GAUZE/BANDAGES/DRESSINGS) IMPLANT
BLADE SURG 10 STRL SS (BLADE) IMPLANT
BLADE SURG 15 STRL LF DISP TIS (BLADE) ×1 IMPLANT
BLADE SURG 15 STRL SS (BLADE) ×2
CANISTER SUCT 1200ML W/VALVE (MISCELLANEOUS) ×3 IMPLANT
CHLORAPREP W/TINT 26 (MISCELLANEOUS) ×3 IMPLANT
CLOSURE WOUND 1/2 X4 (GAUZE/BANDAGES/DRESSINGS)
COVER BACK TABLE REUSABLE LG (DRAPES) ×3 IMPLANT
COVER MAYO STAND REUSABLE (DRAPES) ×3 IMPLANT
COVER WAND RF STERILE (DRAPES) IMPLANT
DECANTER SPIKE VIAL GLASS SM (MISCELLANEOUS) IMPLANT
DERMABOND ADVANCED (GAUZE/BANDAGES/DRESSINGS) ×2
DERMABOND ADVANCED .7 DNX12 (GAUZE/BANDAGES/DRESSINGS) ×1 IMPLANT
DRAPE LAPAROTOMY 100X72 PEDS (DRAPES) ×3 IMPLANT
DRAPE UTILITY XL STRL (DRAPES) ×3 IMPLANT
ELECT COATED BLADE 2.86 ST (ELECTRODE) ×3 IMPLANT
ELECT REM PT RETURN 9FT ADLT (ELECTROSURGICAL) ×3
ELECTRODE REM PT RTRN 9FT ADLT (ELECTROSURGICAL) ×1 IMPLANT
GLOVE BIOGEL PI IND STRL 7.0 (GLOVE) ×1 IMPLANT
GLOVE BIOGEL PI IND STRL 8 (GLOVE) ×1 IMPLANT
GLOVE BIOGEL PI INDICATOR 7.0 (GLOVE) ×2
GLOVE BIOGEL PI INDICATOR 8 (GLOVE) ×2
GLOVE ECLIPSE 6.5 STRL STRAW (GLOVE) ×3 IMPLANT
GLOVE ECLIPSE 8.0 STRL XLNG CF (GLOVE) ×3 IMPLANT
GLOVE EXAM NITRILE MD LF STRL (GLOVE) ×3 IMPLANT
GOWN STRL REUS W/ TWL LRG LVL3 (GOWN DISPOSABLE) ×2 IMPLANT
GOWN STRL REUS W/TWL LRG LVL3 (GOWN DISPOSABLE) ×4
NEEDLE HYPO 25X1 1.5 SAFETY (NEEDLE) ×3 IMPLANT
NS IRRIG 1000ML POUR BTL (IV SOLUTION) ×3 IMPLANT
PACK BASIN DAY SURGERY FS (CUSTOM PROCEDURE TRAY) ×3 IMPLANT
PENCIL BUTTON HOLSTER BLD 10FT (ELECTRODE) ×3 IMPLANT
SLEEVE SCD COMPRESS KNEE MED (MISCELLANEOUS) ×3 IMPLANT
SPONGE LAP 4X18 RFD (DISPOSABLE) ×3 IMPLANT
STRIP CLOSURE SKIN 1/2X4 (GAUZE/BANDAGES/DRESSINGS) IMPLANT
SUT ETHILON 2 0 FS 18 (SUTURE) ×3 IMPLANT
SUT MNCRL AB 4-0 PS2 18 (SUTURE) ×3 IMPLANT
SUT MON AB 4-0 PC3 18 (SUTURE) ×3 IMPLANT
SUT VIC AB 0 SH 27 (SUTURE) ×6 IMPLANT
SUT VICRYL 3-0 CR8 SH (SUTURE) ×3 IMPLANT
SUT VICRYL AB 3 0 TIES (SUTURE) IMPLANT
SYR CONTROL 10ML LL (SYRINGE) ×3 IMPLANT
TOWEL GREEN STERILE FF (TOWEL DISPOSABLE) ×3 IMPLANT
TUBE CONNECTING 20'X1/4 (TUBING) ×1
TUBE CONNECTING 20X1/4 (TUBING) ×2 IMPLANT
YANKAUER SUCT BULB TIP NO VENT (SUCTIONS) ×3 IMPLANT

## 2019-02-18 NOTE — Discharge Instructions (Signed)
GENERAL SURGERY: POST OP INSTRUCTIONS  ######################################################################  EAT Gradually transition to a high fiber diet with a fiber supplement over the next few weeks after discharge.  Start with a pureed / full liquid diet (see below)  WALK Walk an hour a day.  Control your pain to do that.    CONTROL PAIN Control pain so that you can walk, sleep, tolerate sneezing/coughing, go up/down stairs.  HAVE A BOWEL MOVEMENT DAILY Keep your bowels regular to avoid problems.  OK to try a laxative to override constipation.  OK to use an antidairrheal to slow down diarrhea.  Call if not better after 2 tries  CALL IF YOU HAVE PROBLEMS/CONCERNS Call if you are still struggling despite following these instructions. Call if you have concerns not answered by these instructions  ######################################################################    1. DIET: Follow a light bland diet & liquids the first 24 hours after arrival home, such as soup, liquids, starches, etc.  Be sure to drink plenty of fluids.  Quickly advance to a usual solid diet within a few days.  Avoid fast food or heavy meals as your are more likely to get nauseated or have irregular bowels.  A low-fat, high-fiber diet for the rest of your life is ideal.   2. Take your usually prescribed home medications unless otherwise directed. 3. PAIN CONTROL: a. Pain is best controlled by a usual combination of three different methods TOGETHER: i. Ice/Heat ii. Over the counter pain medication iii. Prescription pain medication b. Most patients will experience some swelling and bruising around the incisions.  Ice packs or heating pads (30-60 minutes up to 6 times a day) will help. Use ice for the first few days to help decrease swelling and bruising, then switch to heat to help relax tight/sore spots and speed recovery.  Some people prefer to use ice alone, heat alone, alternating between ice & heat.   Experiment to what works for you.  Swelling and bruising can take several weeks to resolve.   c. It is helpful to take an over-the-counter pain medication regularly for the first few weeks.  Choose one of the following that works best for you: i. Naproxen (Aleve, etc)  Two 220mg  tabs twice a day ii. Ibuprofen (Advil, etc) Three 200mg  tabs four times a day (every meal & bedtime) No ibuprofen until 10:00pm iii. Acetaminophen (Tylenol, etc) 500-650mg  four times a day (every meal & bedtime) No Tylenol until 8:00pm d. A  prescription for pain medication (such as oxycodone, hydrocodone, etc) should be given to you upon discharge.  Take your pain medication as prescribed.  i. If you are having problems/concerns with the prescription medicine (does not control pain, nausea, vomiting, rash, itching, etc), please call us [phone] to see if we need to switch you to a different pain medicine that will work better for you and/or control your side effect better. ii. If you need a refill on your pain medication, please contact your pharmacy.  They will contact our office to request authorization. Prescriptions will not be filled after 5 pm or on week-ends. 4. Avoid getting constipated.  Between the surgery and the pain medications, it is common to experience some constipation.  Increasing fluid intake and taking a fiber supplement (such as Metamucil, Citrucel, FiberCon, MiraLax, etc) 1-2 times a day regularly will usually help prevent this problem from occurring.  A mild laxative (prune juice, Milk of Magnesia, MiraLax, etc) should be taken according to package directions if there are no bowel movements  after 48 hours.   5. Wash / shower every day.  You may shower over the dressings as they are waterproof.  Continue to shower over incision(s) after the dressing is off. 6. Remove your waterproof bandages 5 days after surgery.  You may leave the incision open to air.  You may have skin tapes (Steri Strips) covering  the incision(s).  Leave them on until one week, then remove.  You may replace a dressing/Band-Aid to cover the incision for comfort if you wish.      7. ACTIVITIES as tolerated:   a. You may resume regular (light) daily activities beginning the next day--such as daily self-care, walking, climbing stairs--gradually increasing activities as tolerated.  If you can walk 30 minutes without difficulty, it is safe to try more intense activity such as jogging, treadmill, bicycling, low-impact aerobics, swimming, etc. b. Save the most intensive and strenuous activity for last such as sit-ups, heavy lifting, contact sports, etc  Refrain from any heavy lifting or straining until you are off narcotics for pain control.   c. DO NOT PUSH THROUGH PAIN.  Let pain be your guide: If it hurts to do something, don't do it.  Pain is your body warning you to avoid that activity for another week until the pain goes down. d. You may drive when you are no longer taking prescription pain medication, you can comfortably wear a seatbelt, and you can safely maneuver your car and apply brakes. e. Dennis Bast may have sexual intercourse when it is comfortable.  8. FOLLOW UP in our office a. Please call [facility] at [phone] to set up an appointment to see your surgeon in the office for a follow-up appointment approximately 2-3 weeks after your surgery. b. Make sure that you call for this appointment the day you arrive home to insure a convenient appointment time. 9. IF YOU HAVE DISABILITY OR FAMILY LEAVE FORMS, BRING THEM TO THE OFFICE FOR PROCESSING.  DO NOT GIVE THEM TO YOUR DOCTOR.   WHEN TO CALL us [phone]: 1. Poor pain control 2. Reactions / problems with new medications (rash/itching, nausea, etc)  3. Fever over 101.5 F (38.5 C) 4. Worsening swelling or bruising 5. Continued bleeding from incision. 6. Increased pain, redness, or drainage from the incision 7. Difficulty breathing / swallowing   The clinic staff is  available to answer your questions during regular business hours (8:30am-5pm).  Please dont hesitate to call and ask to speak to one of our nurses for clinical concerns.   If you have a medical emergency, go to the nearest emergency room or call 911.  A surgeon from [facility] is always on call at the [facility], [place], [place], [place], [place]  [address] ? MAIN: [phone] ? TOLL FREE: [phone] ?  FAX [phone] [email or web address]    Post Anesthesia Home Care Instructions  Activity: Get plenty of rest for the remainder of the day. A responsible individual must stay with you for 24 hours following the procedure.  For the next 24 hours, DO NOT: -Drive a car -Paediatric nurse -Drink alcoholic beverages -Take any medication unless instructed by your physician -Make any legal decisions or sign important papers.  Meals: Start with liquid foods such as gelatin or soup. Progress to regular foods as tolerated. Avoid greasy, spicy, heavy foods. If nausea and/or vomiting occur, drink only clear liquids until the nausea and/or vomiting subsides. Call your physician if vomiting continues.  Special Instructions/Symptoms: Your throat may feel dry or sore from the anesthesia or the breathing tube placed in your throat during surgery. If this causes discomfort, gargle with warm salt water. The discomfort should disappear within 24 hours.  If you had a scopolamine patch placed behind your ear for the management of post- operative nausea and/or vomiting:  1. The medication in the patch is effective for 72 hours, after which it should be removed.  Wrap patch in a tissue and discard in the trash. Wash hands thoroughly with soap and water. 2. You may remove the patch earlier than 72 hours if you experience unpleasant side effects which may include dry mouth, dizziness or visual disturbances. 3. Avoid touching the  patch. Wash your hands with soap and water after contact with the patch.

## 2019-02-18 NOTE — Anesthesia Procedure Notes (Signed)
Procedure Name: Intubation Date/Time: 02/18/2019 4:16 PM Performed by: Maryella Shivers, CRNA Pre-anesthesia Checklist: Patient identified, Emergency Drugs available, Suction available and Patient being monitored Patient Re-evaluated:Patient Re-evaluated prior to induction Oxygen Delivery Method: Circle system utilized Preoxygenation: Pre-oxygenation with 100% oxygen Induction Type: IV induction Ventilation: Mask ventilation without difficulty Laryngoscope Size: Mac and 4 Grade View: Grade I Tube type: Oral Tube size: 8.0 mm Number of attempts: 1 Airway Equipment and Method: Stylet and Oral airway Placement Confirmation: ETT inserted through vocal cords under direct vision,  positive ETCO2 and breath sounds checked- equal and bilateral Secured at: 21 cm Tube secured with: Tape Dental Injury: Teeth and Oropharynx as per pre-operative assessment

## 2019-02-18 NOTE — Op Note (Signed)
Preoperative diagnosis: 6 cm x 4 cm sebaceous cyst posterior neck subcutaneous and 1 cm 1 cm epidermal inclusion cyst mid upper back  Postop diagnosis: Same  Procedure: Excision of 6 cm x 4 cm sebaceous cyst posterior neck and excision of 1 cm x 1 cm upper back cyst  Surgeon: Erroll Luna, MD  Anesthesia: General with local  EBL: Minimal  Specimen cyst as above  Drains: None  IV fluids Per anesthesia record  Indications for procedure: The patient is a 63 year old male with sebaceous cyst on his posterior neck and upper back.  These have been infected he desires excision. The procedure has been discussed with the patient.  Alternative therapies have been discussed with the patient.  Operative risks include bleeding,  Infection,  Organ injury,  Nerve injury,  Blood vessel injury,  DVT,  Pulmonary embolism,  Death,  And possible reoperation.  Medical management risks include worsening of present situation.  The success of the procedure is 50 -90 % at treating patients symptoms.  The patient understands and agrees to proceed.  Description of procedure: The patient was met in the holding area and both areas were marked with the assistance of the patient.  Questions were answered.  He was taken back the operating room.  He was supine and intubated on his bed then flipped prone on the operating table with appropriate padding of all pressure points.  Neck and upper back were then prepped and draped in sterile fashion after induction of smooth general anesthesia.  Timeout was done to verify proper procedure and patient and location of lesions which were marked in the holding area curvilinear incision was made above below the cyst in his neck.  This was taken down to the subcutaneous fat and excised.  The cyst in his mid back was excised in a similar fashion using elliptical incisions.  This was taken to the subcutaneous fat.  The wounds were irrigated.  They were closed with 0 Vicryl and 4 Monocryl.  I  placed two #2 nylon sutures into the upper incision and one in the mid incision to take tension off the incision line.  Dermabond applied.  All counts correct.  The patient was placed back supine extubated taken recovery in satisfactory condition.

## 2019-02-18 NOTE — Transfer of Care (Signed)
Immediate Anesthesia Transfer of Care Note  Patient: Greg Oneill  Procedure(s) Performed: EXCISION CYST POSTERIOR NECK AND UPPER BACK (N/A Back)  Patient Location: PACU  Anesthesia Type:General  Level of Consciousness: awake, alert  and oriented  Airway & Oxygen Therapy: Patient Spontanous Breathing and Patient connected to nasal cannula oxygen  Post-op Assessment: Report given to RN and Post -op Vital signs reviewed and stable  Post vital signs: Reviewed and stable  Last Vitals:  Vitals Value Taken Time  BP 173/87 02/18/19 1721  Temp    Pulse 79 02/18/19 1723  Resp 18 02/18/19 1723  SpO2 100 % 02/18/19 1723  Vitals shown include unvalidated device data.  Last Pain:  Vitals:   02/18/19 1359  TempSrc: Oral  PainSc: 0-No pain         Complications: No apparent anesthesia complications

## 2019-02-18 NOTE — Interval H&P Note (Signed)
History and Physical Interval Note:  02/18/2019 3:43 PM  Greg Oneill  has presented today for surgery, with the diagnosis of CYST NECK POSTERIOR NECK AND UPPER BACK.  The various methods of treatment have been discussed with the patient and family. After consideration of risks, benefits and other options for treatment, the patient has consented to  Procedure(s): EXCISION CYST POSTERIOR NECK AND UPPER BACK (N/A) as a surgical intervention.  The patient's history has been reviewed, patient examined, no change in status, stable for surgery.  I have reviewed the patient's chart and labs.  Questions were answered to the patient's satisfaction.     [place]

## 2019-02-18 NOTE — Anesthesia Preprocedure Evaluation (Signed)
Anesthesia Evaluation  Patient identified by MRN, date of birth, ID band Patient awake    Reviewed: Allergy & Precautions, NPO status , Patient's Chart, lab work & pertinent test results  Airway Mallampati: I  TM Distance: >3 FB Neck ROM: Full    Dental   Pulmonary former smoker,    Pulmonary exam normal        Cardiovascular Normal cardiovascular exam     Neuro/Psych Anxiety Depression    GI/Hepatic   Endo/Other  diabetes, Type 2, Oral Hypoglycemic Agents  Renal/GU      Musculoskeletal   Abdominal   Peds  Hematology   Anesthesia Other Findings   Reproductive/Obstetrics                             Anesthesia Physical Anesthesia Plan  ASA: II  Anesthesia Plan: General   Post-op Pain Management:    Induction: Intravenous  PONV Risk Score and Plan: 2 and Ondansetron and Midazolam  Airway Management Planned: Oral ETT  Additional Equipment:   Intra-op Plan:   Post-operative Plan: Extubation in OR  Informed Consent: I have reviewed the patients History and Physical, chart, labs and discussed the procedure including the risks, benefits and alternatives for the proposed anesthesia with the patient or authorized representative who has indicated his/her understanding and acceptance.       Plan Discussed with: CRNA and Surgeon  Anesthesia Plan Comments:         Anesthesia Quick Evaluation

## 2019-02-18 NOTE — Anesthesia Postprocedure Evaluation (Signed)
Anesthesia Post Note  Patient: Erskine Speed  Procedure(s) Performed: EXCISION CYST POSTERIOR NECK AND UPPER BACK (N/A Back)     Patient location during evaluation: PACU Anesthesia Type: General Level of consciousness: awake and alert Pain management: pain level controlled Vital Signs Assessment: post-procedure vital signs reviewed and stable Respiratory status: spontaneous breathing, nonlabored ventilation, respiratory function stable and patient connected to nasal cannula oxygen Cardiovascular status: blood pressure returned to baseline and stable Postop Assessment: no apparent nausea or vomiting Anesthetic complications: no    Last Vitals:  Vitals:   02/18/19 1721 02/18/19 1730  BP: (!) 173/87 (!) 155/92  Pulse: 84 80  Resp: 18 16  Temp: 36.6 C   SpO2: 97% 100%    Last Pain:  Vitals:   02/18/19 1730  TempSrc:   PainSc: 0-No pain                 Linford Quintela DAVID

## 2019-02-19 ENCOUNTER — Encounter (HOSPITAL_BASED_OUTPATIENT_CLINIC_OR_DEPARTMENT_OTHER): Payer: Self-pay | Admitting: Surgery

## 2019-03-17 ENCOUNTER — Other Ambulatory Visit (HOSPITAL_COMMUNITY): Payer: Self-pay | Admitting: Adult Health Nurse Practitioner

## 2019-03-17 ENCOUNTER — Other Ambulatory Visit: Payer: Self-pay

## 2019-03-17 ENCOUNTER — Ambulatory Visit (HOSPITAL_COMMUNITY)
Admission: RE | Admit: 2019-03-17 | Discharge: 2019-03-17 | Disposition: A | Discharge status: Home / Self Care | Payer: POS | Source: Ambulatory Visit | Attending: Adult Health Nurse Practitioner | Admitting: Adult Health Nurse Practitioner

## 2019-03-17 DIAGNOSIS — Z87891 Personal history of nicotine dependence: Secondary | ICD-10-CM | POA: Insufficient documentation

## 2019-03-17 DIAGNOSIS — R05 Cough: Secondary | ICD-10-CM | POA: Insufficient documentation

## 2019-03-17 DIAGNOSIS — R059 Cough, unspecified: Secondary | ICD-10-CM

## 2019-04-11 ENCOUNTER — Institutional Professional Consult (permissible substitution): Payer: POS | Admitting: Internal Medicine

## 2019-04-11 ENCOUNTER — Encounter: Payer: Self-pay | Admitting: Internal Medicine

## 2019-04-11 ENCOUNTER — Other Ambulatory Visit: Payer: Self-pay

## 2019-04-11 ENCOUNTER — Ambulatory Visit: Payer: POS | Admitting: Internal Medicine

## 2019-04-11 DIAGNOSIS — I10 Essential (primary) hypertension: Secondary | ICD-10-CM | POA: Insufficient documentation

## 2019-04-11 DIAGNOSIS — R0609 Other forms of dyspnea: Secondary | ICD-10-CM | POA: Diagnosis not present

## 2019-04-11 DIAGNOSIS — R05 Cough: Secondary | ICD-10-CM | POA: Diagnosis not present

## 2019-04-11 DIAGNOSIS — R058 Other specified cough: Secondary | ICD-10-CM | POA: Insufficient documentation

## 2019-04-11 LAB — CBC WITH DIFFERENTIAL/PLATELET
Basophils Absolute: 0 10*3/uL (ref 0.0–0.1)
Basophils Relative: 0.7 % (ref 0.0–3.0)
Eosinophils Absolute: 0.1 10*3/uL (ref 0.0–0.7)
Eosinophils Relative: 1.9 % (ref 0.0–5.0)
HCT: 44.1 % (ref 39.0–52.0)
Hemoglobin: 15.3 g/dL (ref 13.0–17.0)
Lymphocytes Relative: 22.4 % (ref 12.0–46.0)
Lymphs Abs: 1.5 10*3/uL (ref 0.7–4.0)
MCHC: 34.6 g/dL (ref 30.0–36.0)
MCV: 92.7 fl (ref 78.0–100.0)
Monocytes Absolute: 0.5 10*3/uL (ref 0.1–1.0)
Monocytes Relative: 7.1 % (ref 3.0–12.0)
Neutro Abs: 4.6 10*3/uL (ref 1.4–7.7)
Neutrophils Relative %: 67.9 % (ref 43.0–77.0)
Platelets: 203 10*3/uL (ref 150.0–400.0)
RBC: 4.76 Mil/uL (ref 4.22–5.81)
RDW: 14.1 % (ref 11.5–15.5)
WBC: 6.7 10*3/uL (ref 4.0–10.5)

## 2019-04-11 MED ORDER — TELMISARTAN 40 MG PO TABS: 40.0000 mg | ORAL_TABLET | Freq: Every day | ORAL | 2 refills | Status: DC

## 2019-04-11 MED ORDER — PANTOPRAZOLE SODIUM 40 MG PO TBEC: 40.0000 mg | DELAYED_RELEASE_TABLET | Freq: Every day | ORAL | 2 refills | Status: DC

## 2019-04-11 MED ORDER — BENZONATATE 200 MG PO CAPS: 200.0000 mg | ORAL_CAPSULE | Freq: Three times a day (TID) | ORAL | 1 refills | Status: DC | PRN

## 2019-04-11 MED ORDER — FAMOTIDINE 20 MG PO TABS: ORAL_TABLET | ORAL | 11 refills | Status: DC

## 2019-04-11 NOTE — Patient Instructions (Addendum)
Stop lisinopril  And start micardis 40 mg one daily in its place  For cough take tessalon 200mg  one every 6-8 hours   Pantoprazole (protonix) 40 mg   Take  30-60 min before first meal of the day and Pepcid (famotidine)  20 mg one @  bedtime until return to office - this is the best way to tell whether stomach acid is contributing to your problem.    GERD (REFLUX)  is an extremely common cause of respiratory symptoms just like yours , many times with no obvious heartburn at all.    It can be treated with medication, but also with lifestyle changes including elevation of the head of your bed (ideally with 6 -8inch blocks under the headboard of your bed),  Smoking cessation, avoidance of late meals, excessive alcohol, and avoid fatty foods, chocolate, peppermint, colas, red wine, and acidic juices such as orange juice.  NO MINT OR MENTHOL PRODUCTS SO NO COUGH DROPS  USE SUGARLESS CANDY INSTEAD (Jolley ranchers or Stover's or Life Savers) or even ice chips will also do - the key is to swallow to prevent all throat clearing. NO OIL BASED VITAMINS - use powdered substitutes.  Avoid fish oil when coughing.    Please remember to go to the lab department   for your tests - we will call you with the results when they are available.      Please schedule a follow up office visit in 6 weeks, call sooner if needed with PFT's on return

## 2019-04-11 NOTE — Assessment & Plan Note (Addendum)
Onset was around 2000 - IgE 04/11/2019 pending / Eos 0.1   Upper airway cough syndrome (previously labeled PNDS),  is so named because it's frequently impossible to sort out how much is  CR/sinusitis with freq throat clearing (which can be related to primary GERD)   vs  causing  secondary (" extra esophageal")  GERD from wide swings in gastric pressure that occur with throat clearing, often  promoting self use of mint and menthol lozenges that reduce the lower esophageal sphincter tone and exacerbate the problem further in a cyclical fashion.   These are the same pts (now being labeled as having "irritable larynx syndrome" by some cough centers) who not infrequently have a history of having failed to tolerate ace inhibitors,  dry powder inhalers or biphosphonates or report having atypical/extraesophageal reflux symptoms that don't respond to standard doses of PPI  and are easily confused as having aecopd or asthma flares by even experienced allergists/ pulmonologists (myself included).      >>> rec d/c acei, rx acid suppression/ diet and tessalon prn  and f/u in 6 weeks

## 2019-04-11 NOTE — Assessment & Plan Note (Signed)
Changed acei to arb 04/11/2019 for cough on early inspiration   In the best review of chronic cough to date ( NEJM 2016 375 [id number]) ,  ACEi are now felt to cause cough in up to  20% of pts which is a 4 fold increase from previous reports and does not include the variety of non-specific complaints we see in pulmonary clinic in pts on ACEi but previously attributed to another dx like  Copd/asthma and  include PNDS, throat and upper chest "congestion", "bronchitis", unexplained dyspnea and noct "strangling" sensations, and hoarseness, but also  atypical /refractory GERD symptoms like dysphagia and "bad heartburn"   The only way I know  to prove this is not an "ACEi Case" is a trial off ACEi x a minimum of 6 weeks then regroup   >>>>  Try micardis 40 mg one daily and f/u in 6 weeks

## 2019-04-11 NOTE — Progress Notes (Signed)
Greg Oneill, male    DOB: [birth year],    MRN: [id number]   Brief patient profile:  59 yowm smoked 15 years stopping in 1982 s obvious sequelae then started coughing early 20s esp at bedtime year round and referred to pulmonary clinic 04/11/2019 by Selinda Michaels for abn cxr suggesting copd done 03/17/2019     History of Present Illness  04/11/2019  Pulmonary/ 1st office eval/[facility]  - on ACEi for dm not hbp Chief Complaint  Patient presents with   Consult    Cough for last 15 - 20 years. Possible emphysema.  Dyspnea:  Can swim one lap then stops x 5 years / basketball ok but mostly just shooting hoops / no full court  Cough: dry esp at hs  Sleep: bed is flat  SABA use: none - ? Some better on symbicort    No obvious day to day or daytime variability or assoc excess/ purulent sputum or mucus plugs or hemoptysis or cp or chest tightness, subjective wheeze or overt sinus or hb symptoms.     Also denies any obvious fluctuation of symptoms with weather or environmental changes or other aggravating or alleviating factors except as outlined above   No unusual exposure hx or h/o childhood pna/ asthma or knowledge of premature birth.  Current Allergies, Complete Past Medical History, Past Surgical History, Family History, and Social History were reviewed in [employer] record.  ROS  The following are not active complaints unless bolded Hoarseness, sore throat, dysphagia, dental problems, itching, sneezing,  nasal congestion or discharge of excess mucus or purulent secretions, ear ache,   fever, chills, sweats, unintended wt loss or wt gain, classically pleuritic or exertional cp,  orthopnea pnd or arm/hand swelling  or leg swelling, presyncope, palpitations, abdominal pain, anorexia, nausea, vomiting, diarrhea  or change in bowel habits or change in bladder habits, change in stools or change in urine, dysuria, hematuria,  rash, arthralgias, visual complaints,  headache, numbness, weakness or ataxia or problems with walking or coordination,  change in mood or  memory.            Past Medical History:  Diagnosis Date   Anxiety    Arthritis    bil knees   Depression    Diabetes mellitus without complication ([place])     Outpatient Medications Prior to Visit  Medication Sig Dispense Refill   buPROPion (WELLBUTRIN) 100 MG tablet Take 100 mg by mouth daily. PATIENT TAKES 1/2 TAB DAILY     CIALIS 10 MG tablet Take 10 mg by mouth daily as needed for erectile dysfunction.  0   ciclopirox (PENLAC) 8 % solution Apply 1 application topically daily as needed (fungus).   0   ezetimibe-simvastatin (VYTORIN) 10-20 MG per tablet Take 1 tablet by mouth daily.     lisinopril (PRINIVIL,ZESTRIL) 10 MG tablet Take 10 mg by mouth daily.     metFORMIN (GLUCOPHAGE) 500 MG tablet Take 500 mg by mouth 2 (two) times daily.   0   tamsulosin (FLOMAX) 0.4 MG CAPS capsule Take 1 capsule by mouth daily.     TOUJEO SOLOSTAR 300 UNIT/ML SOPN Inject 22 Units as directed at bedtime.     DRYSOL 20 % external solution Apply 1 application topically daily as needed (moist skin).   2   HYDROcodone-acetaminophen (NORCO/VICODIN) 5-325 MG tablet Take 1 tablet by mouth every 6 (six) hours as needed for moderate pain. (Patient not taking: Reported on 04/11/2019) 15 tablet 0  ibuprofen (ADVIL) 800 MG tablet Take 1 tablet (800 mg total) by mouth every 8 (eight) hours as needed. (Patient not taking: Reported on 04/11/2019) 30 tablet 0      Objective:     BP 130/72 (BP Location: Left Arm, Patient Position: Sitting, Cuff Size: Normal)    Pulse 86    Temp 98.2 F (36.8 C)    Ht 5\' 10"  (1.778 m)    Wt 257 lb 9.6 oz (116.8 kg)    SpO2 97%    BMI 36.96 kg/m   SpO2: 97 %   RA  Obese pleasant wm almost chokes on insp efforts    HEENT : pt wearing mask not removed for exam due to covid -19 concerns.    NECK :  without JVD/Nodes/TM/ nl carotid upstrokes  bilaterally   LUNGS: no acc muscle use,  Nl contour chest which is clear to A and P bilaterally without cough on    exp maneuvers   CV:  RRR  no s3 or murmur or increase in P2, and no edema   ABD:  Quite obese soft and nontender with nl inspiratory excursion in the supine position. No bruits or organomegaly appreciated, bowel sounds nl  MS:  Nl gait/ ext warm without deformities, calf tenderness, cyanosis or clubbing No obvious joint restrictions   SKIN: warm and dry without lesions    NEURO:  alert, approp, nl sensorium with  no motor or cerebellar deficits apparent.      I personally reviewed images and agree with radiology impression as follows:  CXR:   03/17/2019 Probable emphysema without acute abnormality of the lungs. No focal airspace opacity. My review :  Very little evidence to support COPD here  Labs ordered 04/11/2019  :  allergy profile= IgE/ eos and  alpha one AT phenotype           Assessment   Essential hypertension Changed acei to arb 04/11/2019 for cough on early inspiration   In the best review of chronic cough to date ( NEJM 2016 375 [id number]) ,  ACEi are now felt to cause cough in up to  20% of pts which is a 4 fold increase from previous reports and does not include the variety of non-specific complaints we see in pulmonary clinic in pts on ACEi but previously attributed to another dx like  Copd/asthma and  include PNDS, throat and upper chest "congestion", "bronchitis", unexplained dyspnea and noct "strangling" sensations, and hoarseness, but also  atypical /refractory GERD symptoms like dysphagia and "bad heartburn"   The only way I know  to prove this is not an "ACEi Case" is a trial off ACEi x a minimum of 6 weeks then regroup   >>>>  Try micardis 40 mg one daily and f/u in 6 weeks      Upper airway cough syndrome Onset was around 2000 - IgE 04/11/2019 pending / Eos 0.1   Upper airway cough syndrome (previously labeled PNDS),  is so named because  it's frequently impossible to sort out how much is  CR/sinusitis with freq throat clearing (which can be related to primary GERD)   vs  causing  secondary (" extra esophageal")  GERD from wide swings in gastric pressure that occur with throat clearing, often  promoting self use of mint and menthol lozenges that reduce the lower esophageal sphincter tone and exacerbate the problem further in a cyclical fashion.   These are the same pts (now being labeled as having "irritable larynx syndrome"  by some cough centers) who not infrequently have a history of having failed to tolerate ace inhibitors,  dry powder inhalers or biphosphonates or report having atypical/extraesophageal reflux symptoms that don't respond to standard doses of PPI  and are easily confused as having aecopd or asthma flares by even experienced allergists/ pulmonologists (myself included).   >>> rec d/c acei, rx acid suppression/ diet and tessalon prn  and f/u in 6 weeks       DOE (dyspnea on exertion) Quit smoking 1982 - alpha one AT screen 04/11/2019  - 04/11/2019   Walked RA  2 laps @  approx 24ft each @ brisk pace  stopped due to end of study, no sob with sats  Still 97%   When respiratory symptoms begin or become refractory well after a patient reports complete smoking cessation,  Especially when this wasn't the case while they were smoking, a red flag is raised based on the work of Dr Kris Mouton which states:  if you quit smoking when your best day FEV1 is still well preserved it is highly unlikely you will progress to severe disease.  That is to say, once the smoking stops,  the symptoms should not suddenly erupt or markedly worsen.  If so, the differential diagnosis should include  obesity/deconditioning,  LPR/Reflux/Aspiration syndromes,  occult CHF, or  especially side effect of medications commonly used in this population, especially ACEi   >>>>>  rec off acei, return for pfts in 6 weeks to close the loop.       Total  time devoted to counseling  > 50 % of initial 60 min office visit:  reviewed case with pt/ directly observed portions of ambulatory 02 saturation study/  discussion of options/alternatives/ personally creating written customized instructions  in presence of pt  then going over those specific  Instructions directly with the pt including how to use all of the meds but in particular covering each new medication in detail and the difference between the maintenance= "automatic" meds and the prns using an action plan format for the latter (If this problem/symptom => do that organization reading Left to right).  Please see AVS from this visit for a full list of these instructions which I personally wrote for this pt and  are unique to this visit.    Total time devoted to counseling  > 50 % of initial 60 min office visit:  review case with pt/ discussion of options/alternatives/ personally creating written customized instructions  in presence of pt  then going over those specific  Instructions directly with the pt including how to use all of the meds but in particular covering each new medication in detail and the difference between the maintenance= "automatic" meds and the prns using an action plan format for the latter (If this problem/symptom => do that organization reading Left to right).  Please see AVS from this visit for a full list of these instructions which I personally wrote for this pt and  are unique to this visit.      Christinia Gully, MD 04/11/2019

## 2019-04-12 ENCOUNTER — Encounter: Payer: Self-pay | Admitting: Internal Medicine

## 2019-04-12 NOTE — Assessment & Plan Note (Addendum)
Quit smoking 1982 - alpha one AT screen 04/11/2019  - 04/11/2019   Walked RA  2 laps @  approx 257ft each @ brisk pace  stopped due to end of study, no sob with sats  Still 97%    When respiratory symptoms begin or become refractory well after a patient reports complete smoking cessation,  Especially when this wasn't the case while they were smoking, a red flag is raised based on the work of Dr Kris Mouton which states:  if you quit smoking when your best day FEV1 is still well preserved it is highly unlikely you will progress to severe disease.  That is to say, once the smoking stops,  the symptoms should not suddenly erupt or markedly worsen.  If so, the differential diagnosis should include  obesity/deconditioning,  LPR/Reflux/Aspiration syndromes,  occult CHF, or  especially side effect of medications commonly used in this population, especially ACEi   >>>   rec off acei, return for pfts in 6 weeks to close the loop.   Total time devoted to counseling  > 50 % of initial 60 min office visit:  reviewed case with pt/ directly observed portions of ambulatory 02 saturation study/  discussion of options/alternatives/ personally creating written customized instructions  in presence of pt  then going over those specific  Instructions directly with the pt including how to use all of the meds but in particular covering each new medication in detail and the difference between the maintenance= "automatic" meds and the prns using an action plan format for the latter (If this problem/symptom => do that organization reading Left to right).  Please see AVS from this visit for a full list of these instructions which I personally wrote for this pt and  are unique to this visit.    Total time devoted to counseling  > 50 % of initial 60 min office visit:  review case with pt/ discussion of options/alternatives/ personally creating written customized instructions  in presence of pt  then going over those specific   Instructions directly with the pt including how to use all of the meds but in particular covering each new medication in detail and the difference between the maintenance= "automatic" meds and the prns using an action plan format for the latter (If this problem/symptom => do that organization reading Left to right).  Please see AVS from this visit for a full list of these instructions which I personally wrote for this pt and  are unique to this visit.

## 2019-04-15 ENCOUNTER — Telehealth: Payer: Self-pay | Admitting: Internal Medicine

## 2019-04-15 NOTE — Telephone Encounter (Signed)
They are in [facility] result box but basically are wnl

## 2019-04-15 NOTE — Telephone Encounter (Signed)
Spoke with pt, aware of recs.  Nothing further needed at this time- will close encounter.   

## 2019-04-15 NOTE — Telephone Encounter (Signed)
Call returned to patient, confirmed DOB, requesting results of labs.   Also wanted to let MW know he used smokeless tobacco for 23 years.   MW please advise of labs. ( they are not resulted in leslie's box). Thanks.

## 2019-04-17 LAB — ALPHA-1 ANTITRYPSIN PHENOTYPE: A-1 Antitrypsin, Ser: 131 mg/dL (ref 83–199)

## 2019-04-17 LAB — IGE: IgE (Immunoglobulin E), Serum: 133 kU/L — ABNORMAL HIGH (ref <=114)

## 2019-05-14 ENCOUNTER — Telehealth: Payer: Self-pay | Admitting: Internal Medicine

## 2019-05-14 NOTE — Telephone Encounter (Signed)
Routing to [facility] Pool per pre-procedural covid testing protocol.

## 2019-05-15 NOTE — Telephone Encounter (Signed)
Sched for 11/3 @ 1:10 @ [facility].  Nothing further needed.

## 2019-05-20 ENCOUNTER — Other Ambulatory Visit (HOSPITAL_COMMUNITY)
Admission: RE | Admit: 2019-05-20 | Discharge: 2019-05-20 | Disposition: A | Discharge status: Home / Self Care | Payer: POS | Source: Ambulatory Visit | Attending: Internal Medicine | Admitting: Internal Medicine

## 2019-05-20 ENCOUNTER — Other Ambulatory Visit: Payer: Self-pay

## 2019-05-20 DIAGNOSIS — Z20828 Contact with and (suspected) exposure to other viral communicable diseases: Secondary | ICD-10-CM | POA: Diagnosis not present

## 2019-05-20 DIAGNOSIS — Z01812 Encounter for preprocedural laboratory examination: Secondary | ICD-10-CM | POA: Diagnosis not present

## 2019-05-20 LAB — SARS CORONAVIRUS 2 (TAT 6-24 HRS): SARS Coronavirus 2: NEGATIVE

## 2019-05-21 NOTE — Progress Notes (Signed)
mychart note sent

## 2019-05-22 ENCOUNTER — Other Ambulatory Visit: Payer: Self-pay | Admitting: Internal Medicine

## 2019-05-22 ENCOUNTER — Other Ambulatory Visit: Payer: Self-pay

## 2019-05-22 ENCOUNTER — Ambulatory Visit
Admission: EM | Admit: 2019-05-22 | Discharge: 2019-05-22 | Disposition: A | Discharge status: Home / Self Care | Payer: POS | Attending: Emergency Medicine | Admitting: Emergency Medicine

## 2019-05-22 DIAGNOSIS — M545 Low back pain, unspecified: Secondary | ICD-10-CM

## 2019-05-22 DIAGNOSIS — R0609 Other forms of dyspnea: Secondary | ICD-10-CM

## 2019-05-22 MED ORDER — METHYLPREDNISOLONE SODIUM SUCC 125 MG IJ SOLR
125.0000 mg | Freq: Once | INTRAMUSCULAR | Status: AC
  Administered 2019-05-22: 125 mg via INTRAMUSCULAR

## 2019-05-22 MED ORDER — PREDNISONE 20 MG PO TABS: 20.0000 mg | ORAL_TABLET | Freq: Two times a day (BID) | ORAL | 0 refills | Status: AC

## 2019-05-22 MED ORDER — TIZANIDINE HCL 2 MG PO CAPS: 2.0000 mg | ORAL_CAPSULE | Freq: Every evening | ORAL | 0 refills | Status: DC | PRN

## 2019-05-22 NOTE — ED Triage Notes (Signed)
Pt presents with c/o low back pain that he has had for past 2 days after playing golf

## 2019-05-22 NOTE — Discharge Instructions (Addendum)
Steroid shot given in office Continue conservative management of rest, ice, heat, and gentle stretches/ massages Prednisone prescribed.  Take as directed and to completion Follow up with PCP if symptoms persist Return or go to the ER if you have any new or worsening symptoms (fever, chills, chest pain, abdominal pain, changes in bowel or bladder habits, pain radiating into lower legs, etc...)

## 2019-05-22 NOTE — ED Provider Notes (Addendum)
[place]   [id number] 05/22/19 Arrival Time: [id number]  CC: Back pain  SUBJECTIVE: History from: patient. Greg Oneill is a 63 y.o. male complains of right low back pain that began 2 days.  Symptoms began after golfing.  Localizes the pain to the RT low back.  Describes the pain as a spasm.  Pain is 7/10.  Has NOT tried OTC medications.  Symptoms are made worse with movement.   Denies fever, chills, erythema, ecchymosis, effusion, weakness, numbness and tingling, saddle paresthesias, loss of bowel or bladder function.      ROS: As per HPI.  All other pertinent ROS negative.     Past Medical History:  Diagnosis Date  . Anxiety   . Arthritis    bil knees  . Depression   . Diabetes mellitus without complication [facility])    Past Surgical History:  Procedure Laterality Date  . COLONOSCOPY N/A 09/24/2017   Procedure: COLONOSCOPY;  Surgeon: Danie Binder, MD;  Location: [facility];  Service: Endoscopy;  Laterality: N/A;  10:30  . CYST EXCISION N/A 02/18/2019   Procedure: EXCISION CYST POSTERIOR NECK AND UPPER BACK;  Surgeon: Erroll Luna, MD;  Location: [place];  Service: General;  Laterality: N/A;  . INSERTION OF MESH N/A 08/10/2014   Procedure: INSERTION OF MESH;  Surgeon: Jamesetta So, MD;  Location: [facility];  Service: General;  Laterality: N/A;  . PILONIDAL CYST EXCISION  1977   Dr Marnette Burgess  . POLYPECTOMY  09/24/2017   Procedure: POLYPECTOMY;  Surgeon: Danie Binder, MD;  Location: [facility];  Service: Endoscopy;;  colon  . UMBILICAL HERNIA REPAIR N/A 08/10/2014   Procedure: UMBILICAL HERNIORRHAPHY ;  Surgeon: Jamesetta So, MD;  Location: [facility];  Service: General;  Laterality: N/A;   No Known Allergies No current facility-administered medications on file prior to encounter.    Current Outpatient Medications on File Prior to Encounter  Medication Sig Dispense Refill  . buPROPion (WELLBUTRIN) 100 MG tablet Take 100 mg by mouth daily.  PATIENT TAKES 1/2 TAB DAILY    . CIALIS 10 MG tablet Take 10 mg by mouth daily as needed for erectile dysfunction.  0  . ciclopirox (PENLAC) 8 % solution Apply 1 application topically daily as needed (fungus).   0  . ezetimibe-simvastatin (VYTORIN) 10-20 MG per tablet Take 1 tablet by mouth daily.    . famotidine (PEPCID) 20 MG tablet One after supper 30 tablet 11  . metFORMIN (GLUCOPHAGE) 500 MG tablet Take 500 mg by mouth 2 (two) times daily.   0  . pantoprazole (PROTONIX) 40 MG tablet Take 1 tablet (40 mg total) by mouth daily. Take 30-60 min before first meal of the day 30 tablet 2  . tamsulosin (FLOMAX) 0.4 MG CAPS capsule Take 1 capsule by mouth daily.    Marland Kitchen telmisartan (MICARDIS) 40 MG tablet Take 1 tablet (40 mg total) by mouth daily. 30 tablet 2  . TOUJEO SOLOSTAR 300 UNIT/ML SOPN Inject 22 Units as directed at bedtime.     Social History   Socioeconomic History  . Marital status: Married    Spouse name: Not on file  . Number of children: Not on file  . Years of education: Not on file  . Highest education level: Not on file  Occupational History  . Not on file  Social Needs  . Financial resource strain: Not on file  . Food insecurity    Worry: Not on file  Inability: Not on file  . Transportation needs    Medical: Not on file    Non-medical: Not on file  Tobacco Use  . Smoking status: Former Research scientist (life sciences)  . Smokeless tobacco: Former Network engineer and Sexual Activity  . Alcohol use: No  . Drug use: No  . Sexual activity: Not on file  Lifestyle  . Physical activity    Days per week: Not on file    Minutes per session: Not on file  . Stress: Not on file  Relationships  . Social Herbalist on phone: Not on file    Gets together: Not on file    Attends religious service: Not on file    Active member of club or organization: Not on file    Attends meetings of clubs or organizations: Not on file    Relationship status: Not on file  . Intimate partner violence     Fear of current or ex partner: Not on file    Emotionally abused: Not on file    Physically abused: Not on file    Forced sexual activity: Not on file  Other Topics Concern  . Not on file  Social History Narrative  . Not on file   Family History  Problem Relation Age of Onset  . Bladder Cancer Father   . Parkinson's disease Brother   . Colon cancer Neg Hx   . Colon polyps Neg Hx     OBJECTIVE:  Vitals:   05/22/19 1609  BP: (!) 152/88  Pulse: 83  Resp: 20  Temp: 97.8 F (36.6 C)  SpO2: 97%    General appearance: ALERT; in no acute distress.  Head: NCAT Lungs: Normal respiratory effort; CTAB CV: RRR Musculoskeletal: Back Inspection: Skin warm, dry, clear and intact without obvious erythema, effusion, or ecchymosis.  Palpation: TTP over right low back ROM: FROM active and passive Strength: 5/5 shld abduction, 5/5 shld adduction, 5/5 elbow flexion, 5/5 elbow extension, 5/5 grip strength, 5/5 hip flexion, 5/5 knee flexion, 5/5 knee extension Skin: warm and dry Neurologic: Ambulates without difficulty; Sensation intact about the upper/ lower extremities Psychological: alert and cooperative; normal mood and affect   ASSESSMENT & PLAN:  1. Acute right-sided low back pain without sciatica     Meds ordered this encounter  Medications  . methylPREDNISolone sodium succinate (SOLU-MEDROL) 125 mg/2 mL injection 125 mg  . predniSONE (DELTASONE) 20 MG tablet    Sig: Take 1 tablet (20 mg total) by mouth 2 (two) times daily with a meal for 5 days.    Dispense:  10 tablet    Refill:  0    Order Specific Question:   Supervising Provider    Answer:   Raylene Everts [id number]  . tiZANidine (ZANAFLEX) 2 MG capsule    Sig: Take 1 capsule (2 mg total) by mouth at bedtime as needed for muscle spasms.    Dispense:  15 capsule    Refill:  0    Order Specific Question:   Supervising Provider    Answer:   Raylene Everts [id number]   Steroid shot given in office Continue  conservative management of rest, ice, heat, and gentle stretches/ massages Prednisone prescribed.  Take as directed and to completion Follow up with PCP if symptoms persist Return or go to the ER if you have any new or worsening symptoms (fever, chills, chest pain, abdominal pain, changes in bowel or bladder habits, pain radiating into lower legs, etc...)  Reviewed expectations re: course of current medical issues. Questions answered. Outlined signs and symptoms indicating need for more acute intervention. Patient verbalized understanding. After Visit Summary given.    Patient requests muscle relaxer prior to discharged, zanaflex sent to pharmacy on file.      Lestine Box, PA-C 05/22/19 1633

## 2019-05-23 ENCOUNTER — Ambulatory Visit (INDEPENDENT_AMBULATORY_CARE_PROVIDER_SITE_OTHER): Payer: POS | Admitting: Internal Medicine

## 2019-05-23 ENCOUNTER — Encounter: Payer: Self-pay | Admitting: Internal Medicine

## 2019-05-23 DIAGNOSIS — R0600 Dyspnea, unspecified: Secondary | ICD-10-CM

## 2019-05-23 DIAGNOSIS — R05 Cough: Secondary | ICD-10-CM | POA: Diagnosis not present

## 2019-05-23 DIAGNOSIS — Z23 Encounter for immunization: Secondary | ICD-10-CM | POA: Diagnosis not present

## 2019-05-23 DIAGNOSIS — R0609 Other forms of dyspnea: Secondary | ICD-10-CM

## 2019-05-23 DIAGNOSIS — I10 Essential (primary) hypertension: Secondary | ICD-10-CM | POA: Diagnosis not present

## 2019-05-23 DIAGNOSIS — R058 Other specified cough: Secondary | ICD-10-CM

## 2019-05-23 LAB — PULMONARY FUNCTION TEST
DL/VA % pred: 116 %
DL/VA: 4.78 ml/min/mmHg/L
DLCO cor % pred: 100 %
DLCO cor: 27.81 ml/min/mmHg
DLCO unc % pred: 102 %
DLCO unc: 28.35 ml/min/mmHg
FEF 25-75 Post: 5.05 L/sec
FEF 25-75 Pre: 4.47 L/sec
FEF2575-%Change-Post: 12 %
FEF2575-%Pred-Post: 179 %
FEF2575-%Pred-Pre: 158 %
FEV1-%Change-Post: 1 %
FEV1-%Pred-Post: 93 %
FEV1-%Pred-Pre: 91 %
FEV1-Post: 3.33 L
FEV1-Pre: 3.26 L
FEV1FVC-%Change-Post: 3 %
FEV1FVC-%Pred-Pre: 116 %
FEV6-%Change-Post: 0 %
FEV6-%Pred-Post: 81 %
FEV6-%Pred-Pre: 82 %
FEV6-Post: 3.7 L
FEV6-Pre: 3.73 L
FEV6FVC-%Pred-Post: 105 %
FEV6FVC-%Pred-Pre: 105 %
FVC-%Change-Post: -1 %
FVC-%Pred-Post: 77 %
FVC-%Pred-Pre: 78 %
FVC-Post: 3.7 L
FVC-Pre: 3.74 L
Post FEV1/FVC ratio: 90 %
Post FEV6/FVC ratio: 100 %
Pre FEV1/FVC ratio: 87 %
Pre FEV6/FVC Ratio: 100 %
RV % pred: 85 %
RV: 2.06 L
TLC % pred: 84 %
TLC: 6.13 L

## 2019-05-23 NOTE — Progress Notes (Signed)
PFT done today. 

## 2019-05-23 NOTE — Patient Instructions (Addendum)
For drainage / throat tickle try take CHLORPHENIRAMINE  4 mg  (Chlortab 4mg   at [employer] should be easiest to find in the green box)  take one every 4 hours as needed - available over the counter- may cause drowsiness so start with just a bedtime dose or two and see how you tolerate it before trying in daytime.    We can call in gabapentin 100 mg three times a day if not making progress for diagnosis of irritable larynx syndrome    Please schedule a follow up visit in 3 months but call sooner if needed

## 2019-05-23 NOTE — Progress Notes (Signed)
Greg Oneill, male    DOB: 1955-11-08,    MRN: [id number]   Brief patient profile:  10 yowm smoked 15 years stopping in 1980 s obvious sequelae then started coughing early 40s esp at bedtime year round and referred to pulmonary clinic 04/11/2019 by Selinda Michaels for abn cxr suggesting copd done 03/17/2019     History of Present Illness  04/11/2019  Pulmonary/ 1st office eval/Greg Oneill  - on ACEi for dm not hbp Chief Complaint  Patient presents with  . Consult    Cough for last 15 - 20 years. Possible emphysema.  Dyspnea:  Can swim one lap then stops x 5 years / basketball ok but mostly just shooting hoops / no full court  Cough: dry esp at hs  Sleep: bed is flat  SABA use: none - ? Some better on symbicort  rec Stop lisinopril  And start micardis 40 mg one daily in its place For cough take tessalon 200mg  one every 6-8 hours  Pantoprazole (protonix) 40 mg   Take  30-60 min before first meal of the day and Pepcid (famotidine)  20 mg one @  bedtime until return to office - this is the best way to tell whether stomach acid is contributing to your problem.   GERD diet      05/23/2019  f/u ov/Greg Oneill re:  Uacs/   cxr c/w "emphysema"  Chief Complaint  Patient presents with  . Follow-up    PFT done today. Cough is much improved.   Dyspnea:  One lap at pool then rest 15 secs and keep going to complete 30-40 min total  Cough: better to his satisfaction Sleeping: bed is flat two pillows  SABA use: none  02: none     No obvious day to day or daytime variability or assoc excess/ purulent sputum or mucus plugs or hemoptysis or cp or chest tightness, subjective wheeze or overt sinus or hb symptoms.   Sleeping  without nocturnal  or early am exacerbation  of respiratory  c/o's or need for noct saba. Also denies any obvious fluctuation of symptoms with weather or environmental changes or other aggravating or alleviating factors except as outlined above   No unusual exposure hx or h/o  childhood pna/ asthma or knowledge of premature birth.  Current Allergies, Complete Past Medical History, Past Surgical History, Family History, and Social History were reviewed in [employer] record.  ROS  The following are not active complaints unless bolded Hoarseness, sore throat, dysphagia, dental problems, itching, sneezing,  nasal congestion or discharge of excess mucus or purulent secretions, ear ache,   fever, chills, sweats, unintended wt loss or wt gain, classically pleuritic or exertional cp,  orthopnea pnd or arm/hand swelling  or leg swelling, presyncope, palpitations, abdominal pain, anorexia, nausea, vomiting, diarrhea  or change in bowel habits or change in bladder habits, change in stools or change in urine, dysuria, hematuria,  rash, arthralgias/ back pain on 5 d pred course, visual complaints, headache, numbness, weakness or ataxia or problems with walking or coordination,  change in mood or  memory.        Current Meds  Medication Sig  . buPROPion (WELLBUTRIN) 100 MG tablet Take 100 mg by mouth daily. PATIENT TAKES 1/2 TAB DAILY  . CIALIS 10 MG tablet Take 10 mg by mouth daily as needed for erectile dysfunction.  . ciclopirox (PENLAC) 8 % solution Apply 1 application topically daily as needed (fungus).   . ezetimibe-simvastatin (VYTORIN) 10-20  MG per tablet Take 1 tablet by mouth daily.  . famotidine (PEPCID) 20 MG tablet One after supper  . metFORMIN (GLUCOPHAGE) 500 MG tablet Take 500 mg by mouth 2 (two) times daily.   . pantoprazole (PROTONIX) 40 MG tablet Take 1 tablet (40 mg total) by mouth daily. Take 30-60 min before first meal of the day  . predniSONE (DELTASONE) 20 MG tablet Take 1 tablet (20 mg total) by mouth 2 (two) times daily with a meal for 5 days.  . tamsulosin (FLOMAX) 0.4 MG CAPS capsule Take 1 capsule by mouth daily.  Marland Kitchen telmisartan (MICARDIS) 40 MG tablet Take 1 tablet (40 mg total) by mouth daily.  Marland Kitchen tiZANidine (ZANAFLEX) 2 MG  capsule Take 1 capsule (2 mg total) by mouth at bedtime as needed for muscle spasms.  Nelva Nay SOLOSTAR 300 UNIT/ML SOPN Inject 22 Units as directed at bedtime.                     Past Medical History:  Diagnosis Date  . Anxiety   . Arthritis    bil knees  . Depression   . Diabetes mellitus without complication (HCC)        Objective:    amb wm occ throat clearing   Wt Readings from Last 3 Encounters:  05/23/19 256 lb (116.1 kg)  04/11/19 257 lb 9.6 oz (116.8 kg)  02/18/19 256 lb 9.9 oz (116.4 kg)     Vital signs reviewed - Note on arrival 02 sats  98% on RA   HEENT : pt wearing mask not removed for exam due to covid -19 concerns.    NECK :  without JVD/Nodes/TM/ nl carotid upstrokes bilaterally   LUNGS: no acc muscle use,  Nl contour chest which is clear to A and P bilaterally without cough on insp or exp maneuvers   CV:  RRR  no s3 or murmur or increase in P2, and no edema   ABD:  soft and nontender with nl inspiratory excursion in the supine position. No bruits or organomegaly appreciated, bowel sounds nl  MS:  Nl gait/ ext warm without deformities, calf tenderness, cyanosis or clubbing No obvious joint restrictions   SKIN: warm and dry without lesions    NEURO:  alert, approp, nl sensorium with  no motor or cerebellar deficits apparent.                    Assessment

## 2019-05-24 ENCOUNTER — Encounter: Payer: Self-pay | Admitting: Internal Medicine

## 2019-05-24 NOTE — Assessment & Plan Note (Signed)
Quit smoking 1982 -  04/11/2019  Walked RA  2 laps @  approx 253ft each @ brisk pace  stopped due to  End of study, no sob, sats 97%   - alpha one AT screen 04/11/2019   MM   Level 131  - .PFT's  05/23/2019  FEV1 3.33 (93 % ) ratio 0.90  p 1 % improvement from saba p 0 prior to study with DLCO  28.35 (102%) corrects to 4.78 (116%)  for alv volume and FV curve nl but ERV 37%    Assured him he has no significant emphysema and main finding is obesity (see separate a/p)   No pulmonary f/u for this problem needed

## 2019-05-24 NOTE — Assessment & Plan Note (Signed)
Onset was around 2000 - IgE 04/11/2019 =   / Eos 0.1  IgE 133 - 04/11/2019 d/c acei > 05/23/2019  Reported marked improvement  But still some sense of pnds > rec 1st gen H1 blockers per guidelines  Then add gabapentin if not responding   Upper airway cough syndrome (previously labeled PNDS),  is so named because it's frequently impossible to sort out how much is  CR/sinusitis with freq throat clearing (which can be related to primary GERD)   vs  causing  secondary (" extra esophageal")  GERD from wide swings in gastric pressure that occur with throat clearing, often  promoting self use of mint and menthol lozenges that reduce the lower esophageal sphincter tone and exacerbate the problem further in a cyclical fashion.   These are the same pts (now being labeled as having "irritable larynx syndrome" by some cough centers) who not infrequently have a history of having failed to tolerate ace inhibitors,  dry powder inhalers or biphosphonates or report having atypical/extraesophageal reflux symptoms that don't respond to standard doses of PPI  and are easily confused as having aecopd or asthma flares by even experienced allergists/ pulmonologists (myself included).   F/u in 3 months and if better plan to d/c gerd rx and see if flares and if so refer to gi.

## 2019-05-24 NOTE — Assessment & Plan Note (Signed)
Onset was around 2000 - IgE 04/11/2019 =   / Eos 0.1  IgE 133 - 04/11/2019 d/c acei > 05/23/2019  Reported marked improvement  But still some sense of pnds > rec 1st gen H1 blockers per guidelines  Then add gabapentin if not responding   Body mass index is 35.7 kg/m.    No results found for: TSH   Contributing to gerd risk/ doe/reviewed the need and the process to achieve and maintain neg calorie balance with sustained submaximal ex x 30 min daily and attention to lowering calorie intact > defer f/u primary care including intermittently monitoring thyroid status  / refer to nutrition if not done.    I had an extended discussion with the patient reviewing all relevant studies completed to date and  lasting 15 to 20 minutes of a 25 minute visit    Each maintenance medication was reviewed in detail including most importantly the difference between maintenance and prns and under what circumstances the prns are to be triggered using an action plan format that is not reflected in the computer generated alphabetically organized AVS.     Please see AVS for specific instructions unique to this visit that I personally wrote and verbalized to the the pt in detail and then reviewed with pt  by my nurse highlighting any  changes in therapy recommended at today's visit to their plan of care.

## 2019-05-24 NOTE — Assessment & Plan Note (Signed)
Changed acei to arb 04/11/2019 for cough on early inspiration > resolved to his satisfaction 05/23/2019 on gerd Rx and off acei   Although even in retrospect it may not be clear the ACEi contributed to the pt's symptoms,  Pt improved off them and adding them back at this point or in the future would risk confusion in interpretation of non-specific respiratory symptoms to which this patient is prone  ie  Better not to muddy the waters here.   Adequate control on present rx, reviewed in detail with pt > no change in rx needed  = micardis 40 mg daily

## 2019-07-07 ENCOUNTER — Other Ambulatory Visit: Payer: Self-pay

## 2019-07-07 MED ORDER — TELMISARTAN 40 MG PO TABS: 40.0000 mg | ORAL_TABLET | Freq: Every day | ORAL | 2 refills | Status: DC

## 2019-07-10 ENCOUNTER — Telehealth: Payer: Self-pay | Admitting: Internal Medicine

## 2019-07-10 DIAGNOSIS — R05 Cough: Secondary | ICD-10-CM

## 2019-07-10 DIAGNOSIS — R058 Other specified cough: Secondary | ICD-10-CM

## 2019-07-10 MED ORDER — PANTOPRAZOLE SODIUM 40 MG PO TBEC: 40.0000 mg | DELAYED_RELEASE_TABLET | Freq: Every day | ORAL | 5 refills | Status: DC

## 2019-07-10 NOTE — Telephone Encounter (Signed)
Refill of pantoprazole has been sent to pt's preferred pharmacy. Called and spoke with pt letting him know this had been done and he verbalized understanding. Nothing further needed.

## 2019-08-20 ENCOUNTER — Encounter: Payer: Self-pay | Admitting: Adult Health

## 2019-08-20 ENCOUNTER — Ambulatory Visit (INDEPENDENT_AMBULATORY_CARE_PROVIDER_SITE_OTHER): Payer: POS | Admitting: Adult Health

## 2019-08-20 ENCOUNTER — Other Ambulatory Visit: Payer: Self-pay

## 2019-08-20 ENCOUNTER — Ambulatory Visit
Admission: EM | Admit: 2019-08-20 | Discharge: 2019-08-20 | Disposition: A | Discharge status: Home / Self Care | Payer: POS | Attending: Emergency Medicine | Admitting: Emergency Medicine

## 2019-08-20 DIAGNOSIS — F411 Generalized anxiety disorder: Secondary | ICD-10-CM | POA: Diagnosis not present

## 2019-08-20 DIAGNOSIS — F331 Major depressive disorder, recurrent, moderate: Secondary | ICD-10-CM | POA: Diagnosis not present

## 2019-08-20 DIAGNOSIS — F429 Obsessive-compulsive disorder, unspecified: Secondary | ICD-10-CM | POA: Diagnosis not present

## 2019-08-20 DIAGNOSIS — I10 Essential (primary) hypertension: Secondary | ICD-10-CM

## 2019-08-20 MED ORDER — CLONIDINE HCL 0.1 MG PO TABS: 0.1000 mg | ORAL_TABLET | Freq: Once | ORAL | Status: DC

## 2019-08-20 MED ORDER — SERTRALINE HCL 50 MG PO TABS: 50.0000 mg | ORAL_TABLET | Freq: Every day | ORAL | 2 refills | Status: DC

## 2019-08-20 NOTE — Progress Notes (Signed)
[facility] MD/PA/NP Initial Note  08/20/2019 2:50 PM Greg Oneill  MRN:  [id number]  Chief Complaint:  Chief Complaint    Depression     HPI:   Describes mood today as "so-so". Pleasant. Mood symptoms - reports depression, anxiety, and irritability. Stating "I feel like I'm more OCD than anything". Feels like things need to be done "perfectly" or it messes with my mind. Had a "nervous breakdown" when he was 68, and has not been "well grounded" since. Has always had to take a "psych" medication for something. Gets obsessed with things. Once had a "knife" obsession. Now has an obsession with "scratch offs". Stating "it's fun until I lose money". Will stop it for months and then go back to it. Stating "I haven't had anything bother me lately. Also stating "I'm a perfectionist". Feels like he couldn't stop the scratch offs because of the way he "stopped" it. Feels "pretty good" for the most part - "can't stop the irrational thoughts". Stable interest and motivation. Taking medications as prescribed.  Energy levels stable. Active, has a regular exercise routine - swims 6 days a week. 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 Enjoys some usual interests and activities. Married. Lives with wife of 27 years. Has 3 grown sons and 40 grand-daughter age 55. Spending time with family. Appetite adequate. Weight gain. Sleeps well most nights. Averages 7 to 8 hours. Focus and concentration stable. Completing tasks. Managing aspects of household.  Denies SI or HI. Denies AH or VH.  Previous medication trials: Lexapro, Wellbutrin, Prozac, Paxil  Visit Diagnosis:    ICD-10-CM   1. Major depressive disorder, recurrent episode, moderate (HCC)  F33.1   2. Obsessive-compulsive disorder, unspecified type  F42.9   3. Generalized anxiety disorder  F41.1     Past Psychiatric History: 89 - 87 years old was hospitalized at [facility] when he was 64 years old.   Past Medical History:  Past Medical  History:  Diagnosis Date  . Anxiety   . Arthritis    bil knees  . Depression   . Diabetes mellitus without complication [facility])     Past Surgical History:  Procedure Laterality Date  . COLONOSCOPY N/A 09/24/2017   Procedure: COLONOSCOPY;  Surgeon: Danie Binder, MD;  [facility];  Service: Endoscopy;  Laterality: N/A;  10:30  . CYST EXCISION N/A 02/18/2019   Procedure: EXCISION CYST POSTERIOR NECK AND UPPER BACK;  Surgeon: Erroll Luna, MD;  Location: [place];  Service: General;  Laterality: N/A;  . INSERTION OF MESH N/A 08/10/2014   Procedure: INSERTION OF MESH;  Surgeon: Jamesetta So, MD;  Location[facility];  Service: General;  Laterality: N/A;  . PILONIDAL CYST EXCISION  1977   Dr Marnette Burgess  . POLYPECTOMY  09/24/2017   Procedure: POLYPECTOMY;  Surgeon: Danie Binder, MD;  [facility];  Service: Endoscopy;;  colon  . UMBILICAL HERNIA REPAIR N/A 08/10/2014   Procedure: UMBILICAL HERNIORRHAPHY ;  Surgeon: Jamesetta So, MD;  Location[facility];  Service: General;  Laterality: N/A;    Family Psychiatric History: Denies any family history of mental illness.  Family History:  Family History  Problem Relation Age of Onset  . Bladder Cancer Father   . Parkinson's disease Brother   . Colon cancer Neg Hx   . Colon polyps Neg Hx     Social History:  Social History   Socioeconomic History  . Marital status: Married  Spouse name: Not on file  . Number of children: Not on file  . Years of education: Not on file  . Highest education level: Not on file  Occupational History  . Not on file  Tobacco Use  . Smoking status: Former Smoker    Packs/day: 1.00    Years: 14.00    Pack years: 14.00    Types: Cigarettes    Quit date: 07/17/1978    Years since quitting: 41.1  . Smokeless tobacco: Former Network engineer and Sexual Activity  . Alcohol use: No  . Drug use: No  . Sexual activity: Not on file  Other Topics Concern  . Not on file   Social History Narrative  . Not on file   Social Determinants of Health   Financial Resource Strain:   . Difficulty of Paying Living Expenses: Not on file  Food Insecurity:   . Worried About Charity fundraiser in the Last Year: Not on file  . Ran Out of Food in the Last Year: Not on file  Transportation Needs:   . Lack of Transportation (Medical): Not on file  . Lack of Transportation (Non-Medical): Not on file  Physical Activity:   . Days of Exercise per Week: Not on file  . Minutes of Exercise per Session: Not on file  Stress:   . Feeling of Stress : Not on file  Social Connections:   . Frequency of Communication with Friends and Family: Not on file  . Frequency of Social Gatherings with Friends and Family: Not on file  . Attends Religious Services: Not on file  . Active Member of Clubs or Organizations: Not on file  . Attends Archivist Meetings: Not on file  . Marital Status: Not on file    Allergies: No Known Allergies  Metabolic Disorder Labs: No results found for: HGBA1C, MPG No results found for: PROLACTIN No results found for: CHOL, TRIG, HDL, CHOLHDL, VLDL, LDLCALC No results found for: TSH  Therapeutic Level Labs: No results found for: LITHIUM No results found for: VALPROATE No components found for:  CBMZ  Current Medications: Current Outpatient Medications  Medication Sig Dispense Refill  . buPROPion (WELLBUTRIN) 100 MG tablet Take 100 mg by mouth daily. PATIENT TAKES 1/2 TAB DAILY    . CIALIS 10 MG tablet Take 10 mg by mouth daily as needed for erectile dysfunction.  0  . ciclopirox (PENLAC) 8 % solution Apply 1 application topically daily as needed (fungus).   0  . ezetimibe-simvastatin (VYTORIN) 10-20 MG per tablet Take 1 tablet by mouth daily.    . famotidine (PEPCID) 20 MG tablet One after supper 30 tablet 11  . metFORMIN (GLUCOPHAGE) 500 MG tablet Take 500 mg by mouth 2 (two) times daily.   0  . pantoprazole (PROTONIX) 40 MG tablet Take  1 tablet (40 mg total) by mouth daily. Take 30-60 min before first meal of the day 30 tablet 5  . tamsulosin (FLOMAX) 0.4 MG CAPS capsule Take 1 capsule by mouth daily.    Marland Kitchen telmisartan (MICARDIS) 40 MG tablet Take 1 tablet (40 mg total) by mouth daily. 30 tablet 2  . tiZANidine (ZANAFLEX) 2 MG capsule Take 1 capsule (2 mg total) by mouth at bedtime as needed for muscle spasms. 15 capsule 0  . TOUJEO SOLOSTAR 300 UNIT/ML SOPN Inject 22 Units as directed at bedtime.     No current facility-administered medications for this visit.    Medication Side Effects: none  Orders  placed this visit:  No orders of the defined types were placed in this encounter.   Psychiatric Specialty Exam:  Review of Systems  Musculoskeletal: Negative for gait problem.  Neurological: Negative for tremors.  Psychiatric/Behavioral:       Please refer to HPI    There were no vitals taken for this visit.There is no height or weight on file to calculate BMI.  General Appearance: Neat and Well Groomed  Eye Contact:  Good  Speech:  Clear and Coherent and Normal Rate  Volume:  Normal  Mood:  Anxious, Depressed and Irritable  Affect:  Appropriate and Congruent  Thought Process:  Coherent and Descriptions of Associations: Intact  Orientation:  Full (Time, Place, and Person)  Thought Content: Logical   Suicidal Thoughts:  No  Homicidal Thoughts:  No  Memory:  WNL  Judgement:  Good  Insight:  Good  Psychomotor Activity:  Normal  Concentration:  Concentration: Good  Recall:  Good  Fund of Knowledge: Good  Language: Good  Assets:  Communication Skills Desire for Improvement Financial Resources/Insurance Housing Intimacy Leisure Time Physical Health Resilience Social Support Talents/Skills Transportation Vocational/Educational  ADL's:  Intact  Cognition: WNL  Prognosis:  Good   Screenings: None  Receiving Psychotherapy: No    Treatment Plan/Recommendations:   Plan:  PDMP reviewed  1. Add  Zoloft 50mg  daily 2. Stop Wellbutrin 100mg  daily - take 1/2 tablet daily x 7 days, then d/c  RTC 4 weeks  Patient advised to contact office with any questions, adverse effects, or acute worsening in signs and symptoms.   Aloha Gell, NP

## 2019-08-20 NOTE — ED Provider Notes (Signed)
[facility]    CSN: [id number] Arrival date & time: 08/20/19  1559      History   Chief Complaint Chief Complaint  Patient presents with  . Hypertension    HPI Greg Oneill is a 64 y.o. male.   who presents due to elevated blood pressure.  Patient was seen today for counseling services and it was noted that his blood pressure was high.  States blood pressure on average is 140/85.  Takes ARB daily. He is visiting his PCP in 2 days.  Denies HA, vision changes, dizziness, lightheadedness, chest pain, shortness of breath, numbness or tingling in extremities, abdominal pain, changes in bowel or bladder habits.    The history is provided by the patient. No language interpreter was used.    Past Medical History:  Diagnosis Date  . Anxiety   . Arthritis    bil knees  . Depression   . Diabetes mellitus without complication [facility])     Patient Active Problem List   Diagnosis Date Noted  . Morbid obesity due to excess calories ([place]) complicated by hbp,dm [id number]  . Upper airway cough syndrome 04/11/2019  . DOE (dyspnea on exertion) 04/11/2019  . Essential hypertension 04/11/2019  . Special screening for malignant neoplasms, colon     Past Surgical History:  Procedure Laterality Date  . COLONOSCOPY N/A 09/24/2017   Procedure: COLONOSCOPY;  Surgeon: Danie Binder, MD;  [facility];  Service: Endoscopy;  Laterality: N/A;  10:30  . CYST EXCISION N/A 02/18/2019   Procedure: EXCISION CYST POSTERIOR NECK AND UPPER BACK;  Surgeon: Erroll Luna, MD;  Location: [place];  Service: General;  Laterality: N/A;  . INSERTION OF MESH N/A 08/10/2014   Procedure: INSERTION OF MESH;  Surgeon: Jamesetta So, MD;  [facility];  Service: General;  Laterality: N/A;  . PILONIDAL CYST EXCISION  1977   Dr Marnette Burgess  . POLYPECTOMY  09/24/2017   Procedure: POLYPECTOMY;  Surgeon: Danie Binder, MD;  [facility];  Service: Endoscopy;;   colon  . UMBILICAL HERNIA REPAIR N/A 08/10/2014   Procedure: UMBILICAL HERNIORRHAPHY ;  Surgeon: Jamesetta So, MD;  [facility];  Service: General;  Laterality: N/A;       Home Medications    Prior to Admission medications   Medication Sig Start Date End Date Taking? Authorizing Provider  buPROPion (WELLBUTRIN) 100 MG tablet Take 100 mg by mouth daily. PATIENT TAKES 1/2 TAB DAILY    [provider]  CIALIS 10 MG tablet Take 10 mg by mouth daily as needed for erectile dysfunction. 07/18/17   [provider]  ciclopirox (PENLAC) 8 % solution Apply 1 application topically daily as needed (fungus).  08/13/17   [provider]  ezetimibe-simvastatin (VYTORIN) 10-20 MG per tablet Take 1 tablet by mouth daily.    [provider]  famotidine (PEPCID) 20 MG tablet One after supper 04/11/19   Tanda Rockers, MD  metFORMIN (GLUCOPHAGE) 500 MG tablet Take 500 mg by mouth 2 (two) times daily.  08/05/14   [provider]  pantoprazole (PROTONIX) 40 MG tablet Take 1 tablet (40 mg total) by mouth daily. Take 30-60 min before first meal of the day 07/10/19   Tanda Rockers, MD  sertraline (ZOLOFT) 50 MG tablet Take 1 tablet (50 mg total) by mouth daily. 08/20/19   Mozingo, Berdie Ogren, NP  tamsulosin (FLOMAX) 0.4 MG CAPS capsule Take 1 capsule by mouth daily.  12/26/18   [provider]  telmisartan (MICARDIS) 40 MG tablet Take 1 tablet (40 mg total) by mouth daily. 07/07/19   Tanda Rockers, MD  tiZANidine (ZANAFLEX) 2 MG capsule Take 1 capsule (2 mg total) by mouth at bedtime as needed for muscle spasms. 05/22/19   Wurst, [place], PA-C  TOUJEO SOLOSTAR 300 UNIT/ML SOPN Inject 22 Units as directed at bedtime. 07/03/17   [provider]    Family History Family History  Problem Relation Age of Onset  . Bladder Cancer Father   . Parkinson's disease Brother   . Colon cancer Neg Hx   . Colon polyps Neg Hx     Social History Social  History   Tobacco Use  . Smoking status: Former Smoker    Packs/day: 1.00    Years: 14.00    Pack years: 14.00    Types: Cigarettes    Quit date: 07/17/1978    Years since quitting: 41.1  . Smokeless tobacco: Former Network engineer Use Topics  . Alcohol use: No  . Drug use: No     Allergies   Patient has no known allergies.   Review of Systems Review of Systems  Constitutional: Negative.   Respiratory: Negative.   Cardiovascular: Negative.   Neurological: Negative.   All other systems reviewed and are negative.    Physical Exam Triage Vital Signs ED Triage Vitals [08/20/19 1608]  Enc Vitals Group     BP      Pulse      Resp      Temp      Temp src      SpO2      Weight      Height      Head Circumference      Peak Flow      Pain Score 0     Pain Loc      Pain Edu?      Excl. in [place]?    No data found.  Updated Vital Signs BP (!) 190/108   Pulse 84   Temp (!) 97.5 F (36.4 C)   Resp 18   SpO2 95%   Visual Acuity Right Eye Distance:   Left Eye Distance:   Bilateral Distance:    Right Eye Near:   Left Eye Near:    Bilateral Near:     Physical Exam Vitals and nursing note reviewed.  Constitutional:      General: He is not in acute distress.    Appearance: Normal appearance. He is normal weight. He is not ill-appearing or toxic-appearing.  HENT:     Head: Normocephalic.     Right Ear: Tympanic membrane, ear canal and external ear normal. There is no impacted cerumen.     Left Ear: Tympanic membrane, ear canal and external ear normal. There is no impacted cerumen.     Nose: Nose normal. No congestion.     Mouth/Throat:     Mouth: Mucous membranes are moist.     Pharynx: Oropharynx is clear. No oropharyngeal exudate or posterior oropharyngeal erythema.  Cardiovascular:     Rate and Rhythm: Normal rate and regular rhythm.     Pulses: Normal pulses.     Heart sounds: Normal heart sounds. No murmur.  Pulmonary:     Effort: Pulmonary effort is  normal. No respiratory distress.     Breath sounds: Normal breath sounds. No wheezing or rhonchi.  Chest:     Chest wall: No tenderness.  Abdominal:  General: Abdomen is flat. Bowel sounds are normal. There is no distension.     Palpations: There is no mass.     Tenderness: There is no abdominal tenderness.  Skin:    Capillary Refill: Capillary refill takes less than 2 seconds.  Neurological:     General: No focal deficit present.     Mental Status: He is alert and oriented to person, place, and time. Mental status is at baseline.     Cranial Nerves: No cranial nerve deficit.     Sensory: No sensory deficit.     Motor: No weakness.     Coordination: Coordination normal.      UC Treatments / Results  Labs (all labs ordered are listed, but only abnormal results are displayed) Labs Reviewed - No data to display  EKG   Radiology No results found.  Procedures Procedures (including critical care time)  Medications Ordered in UC Medications  cloNIDine (CATAPRES) tablet 0.1 mg (has no administration in time range)    Initial Impression / Assessment and Plan / UC Course  I have reviewed the triage vital signs and the nursing notes.  Pertinent labs & imaging results that were available during my care of the patient were reviewed by me and considered in my medical decision making (see chart for details).   Hypertension Patient is asymptomatic Clonidine 0.1 mg given in office Advised  for low-salt diet Good stress management Keep and follow-up with primary care in 2 days Return for worsening of symptoms  Final Clinical Impressions(s) / UC Diagnoses   Final diagnoses:  Essential hypertension     Discharge Instructions     Clonidine 0.1 mg given in office Advised patient to take Telmisartan daily as prescribed Advised low-salt diet Good stress management  Follow-up with primary care in 2 days     ED Prescriptions    None     PDMP not reviewed this  encounter.   Emerson Monte, [place] 08/20/19 1643

## 2019-08-20 NOTE — Discharge Instructions (Addendum)
Clonidine 0.1 mg given in office Advised patient to take Telmisartan daily as prescribed Advised low-salt diet Good stress management  Follow-up with primary care in 2 days

## 2019-08-20 NOTE — ED Triage Notes (Signed)
[facility] and reported high bp of 211/116

## 2019-08-22 ENCOUNTER — Ambulatory Visit: Payer: POS | Admitting: Internal Medicine

## 2019-08-22 ENCOUNTER — Other Ambulatory Visit: Payer: Self-pay

## 2019-08-22 ENCOUNTER — Encounter: Payer: Self-pay | Admitting: Internal Medicine

## 2019-08-22 DIAGNOSIS — R058 Other specified cough: Secondary | ICD-10-CM

## 2019-08-22 DIAGNOSIS — R05 Cough: Secondary | ICD-10-CM

## 2019-08-22 DIAGNOSIS — I10 Essential (primary) hypertension: Secondary | ICD-10-CM | POA: Diagnosis not present

## 2019-08-22 MED ORDER — GABAPENTIN 100 MG PO CAPS: 100.0000 mg | ORAL_CAPSULE | Freq: Three times a day (TID) | ORAL | 2 refills | Status: DC

## 2019-08-22 MED ORDER — TELMISARTAN 40 MG PO TABS: ORAL_TABLET | ORAL | Status: DC

## 2019-08-22 NOTE — Patient Instructions (Addendum)
Double your telmisartan to 40 mg x 2 = 80 mg one daily   For drainage / throat tickle try take CHLORPHENIRAMINE  4 mg  (Chlortab 4mg   at [employer] should be easiest to find in the green box)  take one every 4 hours as needed - available over the counter- may cause drowsiness so start with just a bedtime dose or two and see how you tolerate it before trying in daytime     Gabapentin 100 mg three times a day trial basis and see me in 6 weeks - if it makes you feel fuzzy headed then start with one daily x one week, then one twice daily x one week, then one three times a day until reutnr    GERD (REFLUX)  is an extremely common cause of respiratory symptoms just like yours , many times with no obvious heartburn at all.    It can be treated with medication, but also with lifestyle changes including elevation of the head of your bed (ideally with 6 -8inch blocks under the headboard of your bed),  Smoking cessation, avoidance of late meals, excessive alcohol, and avoid fatty foods, chocolate, peppermint, colas, red wine, and acidic juices such as orange juice.  NO MINT OR MENTHOL PRODUCTS SO NO COUGH DROPS  USE SUGARLESS CANDY INSTEAD (Jolley ranchers or Stover's or Life Savers) or even ice chips will also do - the key is to swallow to prevent all throat clearing. NO OIL BASED VITAMINS - use powdered substitutes.  Avoid fish oil when coughing.     Follow up with your PCP before you run out of your telmesartan (micardis)

## 2019-08-22 NOTE — Progress Notes (Signed)
Greg Oneill, male    DOB: 03/18/1956,    MRN: [id number]   Brief patient profile:  1 yowm smoked 15 years stopping in 1980 s obvious sequelae then started coughing early 40s esp at bedtime year round and referred to pulmonary clinic 04/11/2019 by Selinda Michaels for abn cxr suggesting copd done 03/17/2019     History of Present Illness  04/11/2019  Pulmonary/ 1st office eval/Greg Oneill  - on ACEi for dm not hbp Chief Complaint  Patient presents with  . Consult    Cough for last 15 - 20 years. Possible emphysema.  Dyspnea:  Can swim one lap then stops x 5 years / basketball ok but mostly just shooting hoops / no full court  Cough: dry esp at hs  Sleep: bed is flat  SABA use: none - ? Some better on symbicort  rec Stop lisinopril  And start micardis 40 mg one daily in its place For cough take tessalon 200mg  one every 6-8 hours  Pantoprazole (protonix) 40 mg   Take  30-60 min before first meal of the day and Pepcid (famotidine)  20 mg one @  bedtime until return to office - this is the best way to tell whether stomach acid is contributing to your problem.   GERD diet      05/23/2019  f/u ov/Greg Oneill re:  [facility]/   cxr c/w "emphysema"  Chief Complaint  Patient presents with  . Follow-up    PFT done today. Cough is much improved.   Dyspnea:  One lap at pool then rest 15 secs and keep going to complete 30-40 min total  Cough: better to his satisfaction Sleeping: bed is flat two pillows  SABA use: none  02: none  rec For drainage / throat tickle try take CHLORPHENIRAMINE  4 mg  (Chlortab 4mg   at [employer] should be easiest to find in the green box)  take one every 4 hours as needed    We can call in gabapentin 100 mg three times a day if not making progress for diagnosis of irritable larynx syndrome    08/22/2019  f/u ov/Greg Oneill re: uacs hbp  Chief Complaint  Patient presents with  . Follow-up    Patient states that his cough and clearing of his throat have got better since  last visit. Patient is having issues with his BP and blood sugar was seen in the ED 08/20/2019.  Dyspnea: swimming fine ex tol Cough: much better, still some throat clearing , only using h1 at hs  Sleeping: fine flat one pillow SABA use: none 02: none    No obvious day to day or daytime variability or assoc excess/ purulent sputum or mucus plugs or hemoptysis or cp or chest tightness, subjective wheeze or overt sinus or hb symptoms.   Sleeping as above  without nocturnal  or early am exacerbation  of respiratory  c/o's or need for noct saba. Also denies any obvious fluctuation of symptoms with weather or environmental changes or other aggravating or alleviating factors except as outlined above   No unusual exposure hx or h/o childhood pna/ asthma or knowledge of premature birth.  Current Allergies, Complete Past Medical History, Past Surgical History, Family History, and Social History were reviewed in [employer] record.  ROS  The following are not active complaints unless bolded Hoarseness, sore throat, dysphagia, dental problems, itching, sneezing,  nasal congestion or discharge of excess mucus or purulent secretions, ear ache,   fever, chills, sweats,  unintended wt loss or wt gain, classically pleuritic or exertional cp,  orthopnea pnd or arm/hand swelling  or leg swelling, presyncope, palpitations, abdominal pain, anorexia, nausea, vomiting, diarrhea  or change in bowel habits or change in bladder habits, change in stools or change in urine, dysuria, hematuria,  rash, arthralgias, visual complaints, headache, numbness, weakness or ataxia or problems with walking or coordination,  change in mood or  memory.        Current Meds  Medication Sig  . ciclopirox (PENLAC) 8 % solution Apply 1 application topically daily as needed (fungus).   . ezetimibe-simvastatin (VYTORIN) 10-20 MG per tablet Take 1 tablet by mouth daily.  . famotidine (PEPCID) 20 MG tablet One after  supper  . metFORMIN (GLUCOPHAGE) 500 MG tablet Take 500 mg by mouth 2 (two) times daily.   . pantoprazole (PROTONIX) 40 MG tablet Take 1 tablet (40 mg total) by mouth daily. Take 30-60 min before first meal of the day  . sertraline (ZOLOFT) 50 MG tablet Take 1 tablet (50 mg total) by mouth daily.  . tamsulosin (FLOMAX) 0.4 MG CAPS capsule Take 1 capsule by mouth daily.  Marland Kitchen telmisartan (MICARDIS) 40 MG tablet Take 1 tablet (40 mg total) by mouth daily.                        Past Medical History:  Diagnosis Date  . Anxiety   . Arthritis    bil knees  . Depression   . Diabetes mellitus without complication (HCC)        Objective:    amb wm still clearing throat    08/22/2019         260   05/23/19 256 lb (116.1 kg)  04/11/19 257 lb 9.6 oz (116.8 kg)  02/18/19 256 lb 9.9 oz (116.4 kg)    Vital signs reviewed  08/22/2019  - Note at rest 02 sats  97% on RA      HEENT : pt wearing mask not removed for exam due to covid -19 concerns.    NECK :  without JVD/Nodes/TM/ nl carotid upstrokes bilaterally   LUNGS: no acc muscle use,  Nl contour chest which is clear to A and P bilaterally without cough on insp or exp maneuvers   CV:  RRR  no s3 or murmur or increase in P2, and no edema   ABD:  soft and nontender with nl inspiratory excursion in the supine position. No bruits or organomegaly appreciated, bowel sounds nl  MS:  Nl gait/ ext warm without deformities, calf tenderness, cyanosis or clubbing No obvious joint restrictions   SKIN: warm and dry without lesions    NEURO:  alert, approp, nl sensorium with  no motor or cerebellar deficits apparent.             Assessment

## 2019-08-24 ENCOUNTER — Encounter: Payer: Self-pay | Admitting: Internal Medicine

## 2019-08-24 NOTE — Assessment & Plan Note (Signed)
Onset was around 2000 - IgE 04/11/2019 =   / Eos 0.1  IgE 133 - 04/11/2019 d/c acei > 05/23/2019  Reported marked improvement  But still some sense of pnds > rec 1st gen H1 blockers per guidelines    - 08/22/2019 added gabapentin titrating up to 100 tid   Much improved but still with incessant daytime urge to clear throat typical of Upper airway cough syndrome (previously labeled PNDS),  is so named because it's frequently impossible to sort out how much is  CR/sinusitis with freq throat clearing (which can be related to primary GERD)   vs  causing  secondary (" extra esophageal")  GERD from wide swings in gastric pressure that occur with throat clearing, often  promoting self use of mint and menthol lozenges that reduce the lower esophageal sphincter tone and exacerbate the problem further in a cyclical fashion.   These are the same pts (now being labeled as having "irritable larynx syndrome" by some cough centers) who not infrequently have a history of having failed to tolerate ace inhibitors,  dry powder inhalers or biphosphonates or report having atypical/extraesophageal reflux symptoms that don't respond to standard doses of PPI  and are easily confused as having aecopd or asthma flares by even experienced allergists/ pulmonologists (myself included).   Of the three most common causes of  Sub-acute / recurrent or chronic cough, only one (GERD)  can actually contribute to/ trigger  the other two (asthma and post nasal drip syndrome)  and perpetuate the cylce of cough.  While not intuitively obvious, many patients with chronic low grade reflux do not cough until there is a primary insult that disturbs the protective epithelial barrier and exposes sensitive nerve endings.   This is typically viral but can due to PNDS and  either may apply here.    >>>  The point is that once this occurs, it is difficult to eliminate the cycle  using anything but a maximally effective acid suppression regimen at least in  the short run, accompanied by an appropriate diet to address non acid GERD and control / eliminate the cough itself with gabapentin up to 100 tid for now plus 1st gen H1 blockers per guidelines if tolerates

## 2019-08-24 NOTE — Assessment & Plan Note (Signed)
Changed acei to arb 04/11/2019 for cough on early inspiration >improved to his satisfaction 05/23/2019 on gerd Rx and off acei   Not opitimally controlled on micardis 40 mg daily  rec double the dose to 80 mg daily then add 12.5 mg daily of hctz if still not satisfied > directed back to PCP for f/u.          Each maintenance medication was reviewed in detail including emphasizing most importantly the difference between maintenance and prns and under what circumstances the prns are to be triggered using an action plan format where appropriate.  Total time for H and P, chart review, counseling,   and generating customized AVS unique to this office visit / charting = 20 min

## 2019-08-25 ENCOUNTER — Ambulatory Visit: Payer: POS | Admitting: Internal Medicine

## 2019-09-15 ENCOUNTER — Telehealth: Payer: Self-pay | Admitting: Internal Medicine

## 2019-09-15 MED ORDER — TELMISARTAN 80 MG PO TABS: 80.0000 mg | ORAL_TABLET | Freq: Every day | ORAL | 1 refills | Status: DC

## 2019-09-15 NOTE — Telephone Encounter (Signed)
Spoke with the pt  He has been taking his micardis 40 mg 2 tablets daily  He is down to his last pills  I advised will send rx for 80 mg to take 1 tablet daily to simplify the rx, and before he runs out next f/u with PCP to take over refills  He verbalized understanding  Nothing further needed  Rx sent

## 2019-09-17 ENCOUNTER — Other Ambulatory Visit: Payer: Self-pay

## 2019-09-17 ENCOUNTER — Ambulatory Visit (INDEPENDENT_AMBULATORY_CARE_PROVIDER_SITE_OTHER): Payer: POS | Admitting: Adult Health

## 2019-09-17 ENCOUNTER — Encounter: Payer: Self-pay | Admitting: Adult Health

## 2019-09-17 DIAGNOSIS — F429 Obsessive-compulsive disorder, unspecified: Secondary | ICD-10-CM | POA: Diagnosis not present

## 2019-09-17 DIAGNOSIS — F411 Generalized anxiety disorder: Secondary | ICD-10-CM

## 2019-09-17 DIAGNOSIS — F331 Major depressive disorder, recurrent, moderate: Secondary | ICD-10-CM

## 2019-09-17 MED ORDER — SERTRALINE HCL 100 MG PO TABS: ORAL_TABLET | ORAL | 2 refills | Status: DC

## 2019-09-17 NOTE — Progress Notes (Signed)
Greg Oneill:[id number] 04/13/1956 64 y.o.  Subjective:   Patient ID:  Greg Oneill is a 64 y.o. (DOB 02-28-1956) male.  Chief Complaint:  Chief Complaint  Patient presents with  . Anxiety  . Depression  . Other    OCD    HPI [name] presents to the office today for follow-up of GAD, MDD, and OCD.  HPI:   Describes mood today as "ok". Pleasant. Mood symptoms - reports decreased depression, anxiety, and irritability. Stating "I feel great". Denies "irrational" thoughts. Stating "man I like this Zoloft, I don't know why I didn't start on it before now". Not as obsessed over things - "I still have a little OCD". Would like to increase dose of Zoloft "a little more". Feels like there is room for more "improvement". Stating "I am doing "better". Stable interest and motivation. Taking medications as prescribed.  Energy levels stable. Active, has a regular exercise routine. Swimmer.  Enjoys some usual interests and activities. Married. Lives with wife of 33 years. Has 3 grown children. 1 granddaughter.  Spending time with family. Appetite adequate. Weight gain. Wanting to lose weight - type 2 diabetic.  Sleeps well most nights. Averages 7 to 8 hours. Focus and concentration stable. Completing tasks. Managing aspects of household. Retired from post office in 2018-32 years. Denies SI or HI. Denies AH or VH.  Previous medication trials: Lexapro, Wellbutrin, Prozac, Paxil   Review of Systems:  Review of Systems  Musculoskeletal: Negative for gait problem.  Neurological: Negative for tremors.  Psychiatric/Behavioral:       Please refer to HPI    Medications: I have reviewed the patient's current medications.  Current Outpatient Medications  Medication Sig Dispense Refill  . buPROPion (WELLBUTRIN SR) 100 MG 12 hr tablet Take 100 mg by mouth every morning.    Marland Kitchen CIALIS 10 MG tablet Take 10 mg by mouth daily as needed for erectile dysfunction.  0  . ciclopirox  (PENLAC) 8 % solution Apply 1 application topically daily as needed (fungus).   0  . ezetimibe-simvastatin (VYTORIN) 10-20 MG per tablet Take 1 tablet by mouth daily.    . famotidine (PEPCID) 20 MG tablet One after supper 30 tablet 11  . gabapentin (NEURONTIN) 100 MG capsule Take 1 capsule (100 mg total) by mouth 3 (three) times daily. One three times daily 90 capsule 2  . metFORMIN (GLUCOPHAGE) 500 MG tablet Take 500 mg by mouth 2 (two) times daily.   0  . ONETOUCH ULTRA test strip USE TO TEST ONCE DAILY ASEDIRECTED.TY    . pantoprazole (PROTONIX) 40 MG tablet Take 1 tablet (40 mg total) by mouth daily. Take 30-60 min before first meal of the day 30 tablet 5  . sertraline (ZOLOFT) 100 MG tablet Take one tablet daily. 30 tablet 2  . SURE COMFORT PEN NEEDLES 32G X 4 MM MISC USE DAILY AS DIRECTEDO    . tamsulosin (FLOMAX) 0.4 MG CAPS capsule Take 1 capsule by mouth daily.    Marland Kitchen telmisartan (MICARDIS) 40 MG tablet Take two at bedtime    . telmisartan (MICARDIS) 80 MG tablet Take 1 tablet (80 mg total) by mouth daily. 30 tablet 1  . TOUJEO SOLOSTAR 300 UNIT/ML SOPN Inject 22 Units as directed at bedtime.     No current facility-administered medications for this visit.    Medication Side Effects: None  Allergies: No Known Allergies  Past Medical History:  Diagnosis Date  . Anxiety   . Arthritis  bil knees  . Depression   . Diabetes mellitus without complication ([place])     Family History  Problem Relation Age of Onset  . Bladder Cancer Father   . Parkinson's disease Brother   . Colon cancer Neg Hx   . Colon polyps Neg Hx     Social History   Socioeconomic History  . Marital status: Married    Spouse name: Not on file  . Number of children: Not on file  . Years of education: Not on file  . Highest education level: Not on file  Occupational History  . Not on file  Tobacco Use  . Smoking status: Former Smoker    Packs/day: 1.00    Years: 14.00    Pack years: 14.00     Types: Cigarettes    Quit date: 07/17/1978    Years since quitting: 41.1  . Smokeless tobacco: Former Network engineer and Sexual Activity  . Alcohol use: No  . Drug use: No  . Sexual activity: Not on file  Other Topics Concern  . Not on file  Social History Narrative  . Not on file   Social Determinants of Health   Financial Resource Strain:   . Difficulty of Paying Living Expenses: Not on file  Food Insecurity:   . Worried About Charity fundraiser in the Last Year: Not on file  . Ran Out of Food in the Last Year: Not on file  Transportation Needs:   . Lack of Transportation (Medical): Not on file  . Lack of Transportation (Non-Medical): Not on file  Physical Activity:   . Days of Exercise per Week: Not on file  . Minutes of Exercise per Session: Not on file  Stress:   . Feeling of Stress : Not on file  Social Connections:   . Frequency of Communication with Friends and Family: Not on file  . Frequency of Social Gatherings with Friends and Family: Not on file  . Attends Religious Services: Not on file  . Active Member of Clubs or Organizations: Not on file  . Attends Archivist Meetings: Not on file  . Marital Status: Not on file  Intimate Partner Violence:   . Fear of Current or Ex-Partner: Not on file  . Emotionally Abused: Not on file  . Physically Abused: Not on file  . Sexually Abused: Not on file    Past Medical History, Surgical history, Social history, and Family history were reviewed and updated as appropriate.   Please see review of systems for further details on the patient's review from today.   Objective:   Physical Exam:  There were no vitals taken for this visit.  Physical Exam Constitutional:      General: He is not in acute distress. Musculoskeletal:        General: No deformity.  Neurological:     Mental Status: He is alert and oriented to person, place, and time.     Coordination: Coordination normal.  Psychiatric:         Attention and Perception: Attention and perception normal. He does not perceive auditory or visual hallucinations.        Mood and Affect: Mood is anxious. Mood is not depressed. Affect is not labile, blunt, angry or inappropriate.        Speech: Speech normal.        Behavior: Behavior normal.        Thought Content: Thought content normal. Thought content is not paranoid or delusional.  Thought content does not include homicidal or suicidal ideation. Thought content does not include homicidal or suicidal plan.        Cognition and Memory: Cognition and memory normal.        Judgment: Judgment normal.     Comments: Insight intact     Lab Review:     Component Value Date/Time   NA 134 (L) 02/14/2019 1013   K 4.4 02/14/2019 1013   CL 100 02/14/2019 1013   CO2 24 02/14/2019 1013   GLUCOSE 151 (H) 02/14/2019 1013   BUN 12 02/14/2019 1013   CREATININE 1.07 02/14/2019 1013   CALCIUM 8.8 (L) 02/14/2019 1013   PROT 6.5 02/14/2019 1013   ALBUMIN 4.0 02/14/2019 1013   AST 37 02/14/2019 1013   ALT 29 02/14/2019 1013   ALKPHOS 67 02/14/2019 1013   BILITOT 1.8 (H) 02/14/2019 1013   GFRNONAA >60 02/14/2019 1013   GFRAA >60 02/14/2019 1013       Component Value Date/Time   WBC 6.7 04/11/2019 1507   RBC 4.76 04/11/2019 1507   HGB 15.3 04/11/2019 1507   HCT 44.1 04/11/2019 1507   PLT 203.0 04/11/2019 1507   MCV 92.7 04/11/2019 1507   MCH 31.1 02/14/2019 1013   MCHC 34.6 04/11/2019 1507   RDW 14.1 04/11/2019 1507   LYMPHSABS 1.5 04/11/2019 1507   MONOABS 0.5 04/11/2019 1507   EOSABS 0.1 04/11/2019 1507   BASOSABS 0.0 04/11/2019 1507    No results found for: POCLITH, LITHIUM   No results found for: PHENYTOIN, PHENOBARB, VALPROATE, CBMZ   .res Assessment: Plan:    Plan:  PDMP reviewed  1. Increase Zoloft 50mg  to 75mg  daily x 7 days, then increase to 100mg  daily  RTC 4 weeks  Patient advised to contact office with any questions, adverse effects, or acute worsening in signs  and symptoms.  Kaiyden was seen today for anxiety, depression and other.  Diagnoses and all orders for this visit:  Generalized anxiety disorder -     sertraline (ZOLOFT) 100 MG tablet; Take one tablet daily.  Obsessive-compulsive disorder, unspecified type -     sertraline (ZOLOFT) 100 MG tablet; Take one tablet daily.  Major depressive disorder, recurrent episode, moderate (HCC) -     sertraline (ZOLOFT) 100 MG tablet; Take one tablet daily.     Please see After Visit Summary for patient specific instructions.  Future Appointments  Date Time Provider Anthem  10/03/2019  1:30 PM Tanda Rockers, MD [facility] None    No orders of the defined types were placed in this encounter.   -------------------------------

## 2019-10-03 ENCOUNTER — Other Ambulatory Visit: Payer: Self-pay

## 2019-10-03 ENCOUNTER — Encounter: Payer: Self-pay | Admitting: Internal Medicine

## 2019-10-03 ENCOUNTER — Ambulatory Visit: Payer: POS | Admitting: Internal Medicine

## 2019-10-03 DIAGNOSIS — I10 Essential (primary) hypertension: Secondary | ICD-10-CM

## 2019-10-03 DIAGNOSIS — R05 Cough: Secondary | ICD-10-CM

## 2019-10-03 DIAGNOSIS — R058 Other specified cough: Secondary | ICD-10-CM

## 2019-10-03 MED ORDER — TELMISARTAN-HCTZ 80-12.5 MG PO TABS: 1.0000 | ORAL_TABLET | Freq: Every day | ORAL | 2 refills | Status: DC

## 2019-10-03 NOTE — Progress Notes (Signed)
Greg Oneill, male    DOB: 04/11/1956,    MRN: [id number]   Brief patient profile:  69 yowm smoked 15 years stopping in 1980 s obvious sequelae then started coughing early 40s esp at bedtime year round and referred to pulmonary clinic 04/11/2019 by  Skeet Latch for abn cxr suggesting copd done 03/17/2019     History of Present Illness  04/11/2019  Pulmonary/ 1st office eval/Chaniah Cisse  - on ACEi for dm not hbp Chief Complaint  Patient presents with  . Consult    Cough for last 15 - 20 years. Possible emphysema.  Dyspnea:  Can swim one lap then stops x 5 years / basketball ok but mostly just shooting hoops / no full court  Cough: dry esp at hs  Sleep: bed is flat  SABA use: none - ? Some better on symbicort  rec Stop lisinopril  And start micardis 40 mg one daily in its place For cough take tessalon 200mg  one every 6-8 hours  Pantoprazole (protonix) 40 mg   Take  30-60 min before first meal of the day and Pepcid (famotidine)  20 mg one @  bedtime until return to office - this is the best way to tell whether stomach acid is contributing to your problem.   GERD diet      05/23/2019  f/u ov/Senita Corredor re:  Uacs/   cxr c/w "emphysema"  Chief Complaint  Patient presents with  . Follow-up    PFT done today. Cough is much improved.   Dyspnea:  One lap at pool then rest 15 secs and keep going to complete 30-40 min total  Cough: better to his satisfaction Sleeping: bed is flat two pillows  SABA use: none  02: none  rec For drainage / throat tickle try take CHLORPHENIRAMINE  4 mg  (Chlortab 4mg   at [employer] should be easiest to find in the green box)  take one every 4 hours as needed    We can call in gabapentin 100 mg three times a day if not making progress for diagnosis of irritable larynx syndrome    08/22/2019  f/u ov/Lyan Holck re: uacs hbp  Chief Complaint  Patient presents with  . Follow-up    Patient states that his cough and clearing of his throat have got better since  last visit. Patient is having issues with his BP and blood sugar was seen in the ED 08/20/2019.  Dyspnea: swimming fine ex tol Cough: much better, still some throat clearing , only using h1 at hs  Sleeping: fine flat one pillow SABA use: none 02: none  rec Double your telmisartan to 40 mg x 2 = 80 mg one daily  For drainage / throat tickle try take CHLORPHENIRAMINE  4 mg   Gabapentin 100 mg three times a day trial basis and see me in 6 weeks - if it makes you feel fuzzy headed then start with one daily x one week, then one twice daily x one week, then one three times a day until reutnr   GERD  Diet .  Follow up with your PCP before you run out of your telmesartan (micardis)       10/03/2019  f/u ov/Rachelle Edwards re:  UACS / HBP  Chief Complaint  Patient presents with  . Follow-up    Cough is much improved since the last visit but has not resolved completely.   Dyspnea: swimming 45 min daily s sob  Cough: snorting and throat clearing mostly now just  at hs but much better than prior to rx  Sleeping: on side bed is flat  SABA use: none 02: none    No obvious day to day or daytime variability or assoc excess/ purulent sputum or mucus plugs or hemoptysis or cp or chest tightness, subjective wheeze or overt sinus or hb symptoms.   Also  denies any obvious fluctuation of symptoms with weather or environmental changes or other aggravating or alleviating factors except as outlined above   No unusual exposure hx or h/o childhood pna/ asthma or knowledge of premature birth.  Current Allergies, Complete Past Medical History, Past Surgical History, Family History, and Social History were reviewed in [employer] record.  ROS  The following are not active complaints unless bolded Hoarseness, sore throat, dysphagia, dental problems, itching, sneezing,  nasal congestion or discharge of excess mucus or purulent secretions, ear ache,   fever, chills, sweats, unintended wt loss or wt  gain, classically pleuritic or exertional cp,  orthopnea pnd or arm/hand swelling  or leg swelling, presyncope, palpitations, abdominal pain, anorexia, nausea, vomiting, diarrhea  or change in bowel habits or change in bladder habits, change in stools or change in urine, dysuria, hematuria,  rash, arthralgias, visual complaints, headache, numbness, weakness or ataxia or problems with walking or coordination,  change in mood or  memory.        Current Meds  Medication Sig  . CIALIS 10 MG tablet Take 10 mg by mouth daily as needed for erectile dysfunction.  . ciclopirox (PENLAC) 8 % solution Apply 1 application topically daily as needed (fungus).   . ezetimibe-simvastatin (VYTORIN) 10-20 MG per tablet Take 1 tablet by mouth daily.  . famotidine (PEPCID) 20 MG tablet One after supper  . gabapentin (NEURONTIN) 100 MG capsule Take 1 capsule (100 mg total) by mouth 3 (three) times daily. One three times daily  . metFORMIN (GLUCOPHAGE) 500 MG tablet Take 500 mg by mouth 2 (two) times daily.   Glory Rosebush ULTRA test strip USE TO TEST ONCE DAILY ASEDIRECTED.TY  . pantoprazole (PROTONIX) 40 MG tablet Take 1 tablet (40 mg total) by mouth daily. Take 30-60 min before first meal of the day  . sertraline (ZOLOFT) 100 MG tablet Take one tablet daily.  . SURE COMFORT PEN NEEDLES 32G X 4 MM MISC USE DAILY AS DIRECTEDO  . tamsulosin (FLOMAX) 0.4 MG CAPS capsule Take 1 capsule by mouth daily.  Marland Kitchen telmisartan (MICARDIS) 80 MG tablet Take 1 tablet (80 mg total) by mouth daily.  Nelva Nay SOLOSTAR 300 UNIT/ML SOPN Inject 22 Units as directed at bedtime.                        Past Medical History:  Diagnosis Date  . Anxiety   . Arthritis    bil knees  . Depression   . Diabetes mellitus without complication (HCC)        Objective:      10/03/2019        259  08/22/2019         260   05/23/19 256 lb (116.1 kg)  04/11/19 257 lb 9.6 oz (116.8 kg)  02/18/19 256 lb 9.9 oz (116.4 kg)     amb mod  obese wm nad with occ "hocking/ snorting" noted   Vital signs reviewed  10/03/2019  - Note at rest 02 sats  97% on RA  And BP  156/88      HEENT : pt  wearing mask not removed for exam due to covid -19 concerns.    NECK :  without JVD/Nodes/TM/ nl carotid upstrokes bilaterally   LUNGS: no acc muscle use,  Nl contour chest which is clear to A and P bilaterally without cough on insp or exp maneuvers   CV:  RRR  no s3 or murmur or increase in P2, and no edema   ABD:  soft and nontender with nl inspiratory excursion in the supine position. No bruits or organomegaly appreciated, bowel sounds nl  MS:  Nl gait/ ext warm without deformities, calf tenderness, cyanosis or clubbing No obvious joint restrictions   SKIN: warm and dry without lesions    NEURO:  alert, approp, nl sensorium with  no motor or cerebellar deficits apparent.              Assessment

## 2019-10-03 NOTE — Patient Instructions (Addendum)
Chlorpheniramine 4 mg x 2  One hour before bedtime   Change micardis(telmasartan)  80-12.5 mg one daily   Gabapentin 100 mg  X 2  With supper and if night symptoms aren't better you will need to see ENT   Please schedule a follow up office visit in 6 weeks, call sooner if needed in the [place] office

## 2019-10-05 ENCOUNTER — Encounter: Payer: Self-pay | Admitting: Internal Medicine

## 2019-10-05 NOTE — Assessment & Plan Note (Signed)
Onset was around 2000 - IgE 04/11/2019 =   / Eos 0.1  IgE 133 - 04/11/2019 d/c acei > 05/23/2019  Reported marked improvement  But still some sense of pnds > rec 1st gen H1 blockers per guidelines    - 08/22/2019 added gabapentin titrating up to 100 tid  - 10/03/2019 increased pm  gabapentin and 1st gen H1 blockers per guidelines  For sensation of pnds and if not better rec ent eval   Much better though not clear what's making him feel a globus sensation or urge to snort at hs and if not improved then ent eval would be next obvious step, with consideration for formal allergy eval as well as does have elevated IgE and sleeps with dog who he insists is not the problem.   >>> either way f/u is 6 weeks in the [place] office.

## 2019-10-05 NOTE — Assessment & Plan Note (Signed)
Changed acei to arb 04/11/2019 for cough on early inspiration >improved to his satisfaction 05/23/2019    Not optimally controlled on present regimen. I reviewed this with the patient and emphasized importance of follow-up with primary care and in the meantime will try adding 12.5 mg hctz to the micaridis 80           Each maintenance medication was reviewed in detail including emphasizing most importantly the difference between maintenance and prns and under what circumstances the prns are to be triggered using an action plan format where appropriate.  Total time for H and P, chart review, counseling, addressing multiple unrelated problems  and generating customized AVS unique to this office visit / charting = 30 min

## 2019-10-15 ENCOUNTER — Ambulatory Visit: Payer: Medicare Other | Admitting: Adult Health

## 2019-10-16 ENCOUNTER — Telehealth: Payer: Self-pay | Admitting: Internal Medicine

## 2019-10-16 NOTE — Telephone Encounter (Signed)
Patient Instructions by Tanda Rockers, MD at 10/03/2019 1:30 PM Author: Tanda Rockers, MD Author Type: Physician Filed: 10/03/2019 1:49 PM  Note Status: Addendum Mickle Mallory: Cosign Not Required Encounter Date: 10/03/2019  Editor: Tanda Rockers, MD (Physician)  Prior Versions: 1. Tanda Rockers, MD (Physician) at 10/03/2019 1:48 PM - Addendum   2. Tanda Rockers, MD (Physician) at 10/03/2019 1:45 PM - Signed    Chlorpheniramine 4 mg x 2  One hour before bedtime   Change micardis(telmasartan)  80-12.5 mg one daily   Gabapentin 100 mg  X 2  With supper and if night symptoms aren't better you will need to see ENT   Please schedule a follow up office visit in 6 weeks, call sooner if needed in the [place] office        Called and spoke with pt letting him know info stated by MW in last visit and stated to him if symptoms were not better that he would need to see ENT. Pt verbalized understanding and stated he has made an appt for ENT Dr. Benjamine Mola. Stated to pt to stay on gabapentin until that visit and discuss it with Dr. Benjamine Mola and he verbalized understanding. Nothing further needed.

## 2019-10-22 ENCOUNTER — Ambulatory Visit: Payer: Medicare Other | Admitting: Adult Health

## 2019-10-27 ENCOUNTER — Encounter: Payer: Self-pay | Admitting: Adult Health

## 2019-10-27 ENCOUNTER — Other Ambulatory Visit: Payer: Self-pay

## 2019-10-27 ENCOUNTER — Ambulatory Visit (INDEPENDENT_AMBULATORY_CARE_PROVIDER_SITE_OTHER): Payer: POS | Admitting: Adult Health

## 2019-10-27 DIAGNOSIS — F411 Generalized anxiety disorder: Secondary | ICD-10-CM | POA: Diagnosis not present

## 2019-10-27 DIAGNOSIS — F429 Obsessive-compulsive disorder, unspecified: Secondary | ICD-10-CM

## 2019-10-27 MED ORDER — SERTRALINE HCL 100 MG PO TABS: ORAL_TABLET | ORAL | 1 refills | Status: DC

## 2019-10-27 NOTE — Progress Notes (Signed)
Greg Oneill [id number] [birth year] 64 y.o.  Subjective:   Patient ID:  Greg Oneill is a 64 y.o. (DOB Jul 07, 1956) male.  Chief Complaint: No chief complaint on file.   HPI Greg Oneill presents to the office today for follow-up of GAD, MDD, and OCD.  Describes mood today as "ok". Pleasant. Mood symptoms - denies depression, anxiety, and irritability. Denies any "irrational" thoughts. Has "minute" stuff as far as OCD is concerned". Stating "I feel happy about myself and happy about life". Also stating "I feel 100% better". Stable interest and motivation. Taking medications as prescribed.  Energy levels stable. Active, has a regular exercise routine. Swimming. Golfing.  Enjoys some usual interests and activities. Married. Lives with wife of 48 years. Has 3 grown children. 1 granddaughter. Spending time with family. Appetite adequate. Weight stable - type 2 diabetic - A1C 8.  Sleeps well most nights. Averages 7 to 8 hours - Takes a "while" to sleep at night.  Focus and concentration stable. Completing tasks. Managing aspects of household. Retired from post office in 2018-32 years. Denies SI or HI. Denies AH or VH.  Previous medication trials: Lexapro, Wellbutrin, Prozac, Paxil  Review of Systems:  Review of Systems  Musculoskeletal: Negative for gait problem.  Neurological: Negative for tremors.  Psychiatric/Behavioral:       Please refer to HPI    Medications: I have reviewed the patient's current medications.  Current Outpatient Medications  Medication Sig Dispense Refill  . CIALIS 10 MG tablet Take 10 mg by mouth daily as needed for erectile dysfunction.  0  . ciclopirox (PENLAC) 8 % solution Apply 1 application topically daily as needed (fungus).   0  . ezetimibe-simvastatin (VYTORIN) 10-20 MG per tablet Take 1 tablet by mouth daily.    . famotidine (PEPCID) 20 MG tablet One after supper 30 tablet 11  . gabapentin (NEURONTIN) 100 MG capsule Take 1 capsule  (100 mg total) by mouth 3 (three) times daily. One three times daily 90 capsule 2  . metFORMIN (GLUCOPHAGE) 500 MG tablet Take 500 mg by mouth 2 (two) times daily.   0  . ONETOUCH ULTRA test strip USE TO TEST ONCE DAILY ASEDIRECTED.TY    . pantoprazole (PROTONIX) 40 MG tablet Take 1 tablet (40 mg total) by mouth daily. Take 30-60 min before first meal of the day 30 tablet 5  . sertraline (ZOLOFT) 100 MG tablet Take one tablet daily. 30 tablet 2  . SURE COMFORT PEN NEEDLES 32G X 4 MM MISC USE DAILY AS DIRECTEDO    . tamsulosin (FLOMAX) 0.4 MG CAPS capsule Take 1 capsule by mouth daily.    Marland Kitchen telmisartan-hydrochlorothiazide (MICARDIS HCT) 80-12.5 MG tablet Take 1 tablet by mouth daily. 30 tablet 2  . TOUJEO SOLOSTAR 300 UNIT/ML SOPN Inject 22 Units as directed at bedtime.     No current facility-administered medications for this visit.    Medication Side Effects: None  Allergies: No Known Allergies  Past Medical History:  Diagnosis Date  . Anxiety   . Arthritis    bil knees  . Depression   . Diabetes mellitus without complication ([place])     Family History  Problem Relation Age of Onset  . Bladder Cancer Father   . Parkinson's disease Brother   . Colon cancer Neg Hx   . Colon polyps Neg Hx     Social History   Socioeconomic History  . Marital status: Married    Spouse name: Not on file  . Number  of children: Not on file  . Years of education: Not on file  . Highest education level: Not on file  Occupational History  . Not on file  Tobacco Use  . Smoking status: Former Smoker    Packs/day: 1.00    Years: 14.00    Pack years: 14.00    Types: Cigarettes    Quit date: 07/17/1978    Years since quitting: 41.3  . Smokeless tobacco: Former Network engineer and Sexual Activity  . Alcohol use: No  . Drug use: No  . Sexual activity: Not on file  Other Topics Concern  . Not on file  Social History Narrative  . Not on file   Social Determinants of Health   Financial  Resource Strain:   . Difficulty of Paying Living Expenses:   Food Insecurity:   . Worried About Charity fundraiser in the Last Year:   . Arboriculturist in the Last Year:   Transportation Needs:   . Film/video editor (Medical):   Marland Kitchen Lack of Transportation (Non-Medical):   Physical Activity:   . Days of Exercise per Week:   . Minutes of Exercise per Session:   Stress:   . Feeling of Stress :   Social Connections:   . Frequency of Communication with Friends and Family:   . Frequency of Social Gatherings with Friends and Family:   . Attends Religious Services:   . Active Member of Clubs or Organizations:   . Attends Archivist Meetings:   Marland Kitchen Marital Status:   Intimate Partner Violence:   . Fear of Current or Ex-Partner:   . Emotionally Abused:   Marland Kitchen Physically Abused:   . Sexually Abused:     Past Medical History, Surgical history, Social history, and Family history were reviewed and updated as appropriate.   Please see review of systems for further details on the patient's review from today.   Objective:   Physical Exam:  There were no vitals taken for this visit.  Physical Exam Constitutional:      General: He is not in acute distress. Musculoskeletal:        General: No deformity.  Neurological:     Mental Status: He is alert and oriented to person, place, and time.     Coordination: Coordination normal.  Psychiatric:        Attention and Perception: Attention and perception normal. He does not perceive auditory or visual hallucinations.        Mood and Affect: Mood normal. Mood is not anxious or depressed. Affect is not labile, blunt, angry or inappropriate.        Speech: Speech normal.        Behavior: Behavior normal.        Thought Content: Thought content normal. Thought content is not paranoid or delusional. Thought content does not include homicidal or suicidal ideation. Thought content does not include homicidal or suicidal plan.        Cognition  and Memory: Cognition and memory normal.        Judgment: Judgment normal.     Comments: Insight intact     Lab Review:     Component Value Date/Time   NA 134 (L) 02/14/2019 1013   K 4.4 02/14/2019 1013   CL 100 02/14/2019 1013   CO2 24 02/14/2019 1013   GLUCOSE 151 (H) 02/14/2019 1013   BUN 12 02/14/2019 1013   CREATININE 1.07 02/14/2019 1013   CALCIUM 8.8 (L)  02/14/2019 1013   PROT 6.5 02/14/2019 1013   ALBUMIN 4.0 02/14/2019 1013   AST 37 02/14/2019 1013   ALT 29 02/14/2019 1013   ALKPHOS 67 02/14/2019 1013   BILITOT 1.8 (H) 02/14/2019 1013   GFRNONAA >60 02/14/2019 1013   GFRAA >60 02/14/2019 1013       Component Value Date/Time   WBC 6.7 04/11/2019 1507   RBC 4.76 04/11/2019 1507   HGB 15.3 04/11/2019 1507   HCT 44.1 04/11/2019 1507   PLT 203.0 04/11/2019 1507   MCV 92.7 04/11/2019 1507   MCH 31.1 02/14/2019 1013   MCHC 34.6 04/11/2019 1507   RDW 14.1 04/11/2019 1507   LYMPHSABS 1.5 04/11/2019 1507   MONOABS 0.5 04/11/2019 1507   EOSABS 0.1 04/11/2019 1507   BASOSABS 0.0 04/11/2019 1507    No results found for: POCLITH, LITHIUM   No results found for: PHENYTOIN, PHENOBARB, VALPROATE, CBMZ   .res Assessment: Plan:    Plan:  PDMP reviewed  1.  Zoloft 100mg  daily  RTC 6 months   Patient advised to contact office with any questions, adverse effects, or acute worsening in signs and symptoms.   There are no diagnoses linked to this encounter.   Please see After Visit Summary for patient specific instructions.  No future appointments.  No orders of the defined types were placed in this encounter.   -------------------------------

## 2019-11-25 ENCOUNTER — Telehealth: Payer: Self-pay | Admitting: Internal Medicine

## 2019-11-25 MED ORDER — GABAPENTIN 100 MG PO CAPS: 100.0000 mg | ORAL_CAPSULE | Freq: Four times a day (QID) | ORAL | 1 refills | Status: DC

## 2019-11-25 NOTE — Telephone Encounter (Signed)
Spoke with pt and advised rx sent to pharmacy. Nothing further is needed.   

## 2019-11-25 NOTE — Telephone Encounter (Signed)
LMTCB x 1 

## 2019-11-25 NOTE — Telephone Encounter (Signed)
Pt requesting refill of Gabapentin.[facility] [address] Next ov 12/01/19 with MW.   Patient Instructions by Tanda Rockers, MD at 10/03/2019 1:30 PM Author: Tanda Rockers, MD Author Type: Physician Filed: 10/03/2019 1:49 PM  Note Status: Addendum Cosign: Cosign Not Required Encounter Date: 10/03/2019  Editor: Tanda Rockers, MD (Physician)  Prior Versions: 1. Tanda Rockers, MD (Physician) at 10/03/2019 1:48 PM - Addendum   2. Tanda Rockers, MD (Physician) at 10/03/2019 1:45 PM - Signed    Chlorpheniramine 4 mg x 2  One hour before bedtime   Change micardis(telmasartan)  80-12.5 mg one daily   Gabapentin 100 mg  X 2  With supper and if night symptoms aren't better you will need to see ENT   Please schedule a follow up office visit in 6 weeks, call sooner if needed in the [place] office

## 2019-11-25 NOTE — Telephone Encounter (Signed)
Ok to refill but needs enough until he returns to see me   Find out how much he's really taking   1 m supply then ov before ov in [place] with all meds in hand before runs out

## 2019-11-26 NOTE — Telephone Encounter (Signed)
Patient has number for prior authorization is [id number]. Insurance is [employer]. Patient phone number is [phone].

## 2019-11-26 NOTE — Telephone Encounter (Signed)
Left message for patient to call back  

## 2019-11-26 NOTE — Telephone Encounter (Signed)
Spoke with patient. Advised him that I would start the PA this morning. He verbalized understanding.   Attempted PA on [email or web address] but received a message that the patient is covered under multiple plans.   Called [phone] and spoke with [facility]. She stated that the RX will need to be changed in order for the insurance to pay. If he is needing to 100mg  4x daily, she suggested changing the RX to either 200mg  or 400mg . The insurance will only pay for 90 capsules in a 30 day window.   I did look to see if gabapentin comes in a 200mg  in Epic but did not see one.   MW, please advise. Thanks!

## 2019-11-26 NOTE — Telephone Encounter (Signed)
Change it to 300 mg three times a day for 90 pills and tell pt to take the amount closest to what he's presently doing   eg 100 x 2 twice a day is close enough to 300 mg bid

## 2019-11-27 MED ORDER — GABAPENTIN 300 MG PO CAPS: 300.0000 mg | ORAL_CAPSULE | Freq: Three times a day (TID) | ORAL | 0 refills | Status: DC

## 2019-11-27 NOTE — Telephone Encounter (Signed)
Called and spoke with pt letting him know the info stated by MW and he verbalized understanding. Stated to him to just take it at night per MW until he regroups at office and also stated to him that he needs to bring all meds with him to appt. Pt verbalized understanding. Nothing further needed.

## 2019-11-27 NOTE — Telephone Encounter (Signed)
So the closest dose is 300 mg at hs but give him rx for 300 tid and ask him just to take the 300 mg hs until returns to regroup but bring all active meds with him on day of ov

## 2019-11-27 NOTE — Telephone Encounter (Signed)
ATC Patient X2 LMTCB

## 2019-11-27 NOTE — Telephone Encounter (Signed)
Dr. Melvyn Novas please advise on how much Gabapentin this patient should be taking. He is almost out of this medication and also has an appointment with you on Monday.  Right now he is taking Take 1 capsule (100 mg total) by mouth 4 (four) times daily=400mg   The message you sent yesterday states:  Change it to 300 mg three times a day=900mg   for 90 pills and tell pt to take the amount closest to what he's presently doing .   Called [phone] and spoke with [facility] 100mg  90 capsules/23 day per period and  270 capsules/68 day period. 270 capsules can be done for 90 days.

## 2019-12-01 ENCOUNTER — Ambulatory Visit (INDEPENDENT_AMBULATORY_CARE_PROVIDER_SITE_OTHER): Payer: POS | Admitting: Internal Medicine

## 2019-12-01 ENCOUNTER — Encounter: Payer: Self-pay | Admitting: Internal Medicine

## 2019-12-01 ENCOUNTER — Other Ambulatory Visit: Payer: Self-pay

## 2019-12-01 DIAGNOSIS — R058 Other specified cough: Secondary | ICD-10-CM

## 2019-12-01 DIAGNOSIS — R05 Cough: Secondary | ICD-10-CM

## 2019-12-01 DIAGNOSIS — I10 Essential (primary) hypertension: Secondary | ICD-10-CM

## 2019-12-01 NOTE — Assessment & Plan Note (Signed)
Onset was around 2000 - IgE 04/11/2019 =   / Eos 0.1  IgE 133 - 04/11/2019 d/c acei > 05/23/2019  Reported marked improvement  But still some sense of pnds > rec 1st gen H1 blockers per guidelines    - 08/22/2019 added gabapentin titrating up to 100 tid  - 10/03/2019 increased pm  gabapentin and 1st gen H1 blockers per guidelines  For sensation of pnds and if not better rec ent eval - Teoh eval rx nasal steroids  - improved on  h1 x 2 at hs plus gabapentin 300 mg qhs 12/01/2019 so try 300 mg bid if tol   Classic irritable larynx syndrome perhaps triggered partly by rhinitis but note absence of expectorated sputum > rec increase gabapentin to whatever dose he can tolerate with goal of complete elimination x 4 weeks then back off the gabapentin to see if still flares.

## 2019-12-01 NOTE — Patient Instructions (Addendum)
Take blood pressure pill first thing in am (telmisartan)  Try gabapentin 300 mg  Twice daily if it doesn't make you sleepy   Please schedule a follow up visit in 3 months but call sooner if needed

## 2019-12-01 NOTE — Progress Notes (Signed)
Erskine Speed, male    DOB: 08/15/1955,    MRN: [id number]   Brief patient profile:  29 yowm smoked 15 years stopping in 1980 s obvious sequelae then started coughing early 40s esp at bedtime year round and referred to pulmonary clinic 04/11/2019 by  Skeet Latch for abn cxr suggesting copd done 03/17/2019     History of Present Illness  04/11/2019  Pulmonary/ 1st office eval/Daneisha Surges  - on ACEi for dm not hbp Chief Complaint  Patient presents with  . Consult    Cough for last 15 - 20 years. Possible emphysema.  Dyspnea:  Can swim one lap then stops x 5 years / basketball ok but mostly just shooting hoops / no full court  Cough: dry esp at hs  Sleep: bed is flat  SABA use: none - ? Some better on symbicort  rec Stop lisinopril  And start micardis 40 mg one daily in its place For cough take tessalon 200mg  one every 6-8 hours  Pantoprazole (protonix) 40 mg   Take  30-60 min before first meal of the day and Pepcid (famotidine)  20 mg one @  bedtime until return to office - this is the best way to tell whether stomach acid is contributing to your problem.   GERD diet      05/23/2019  f/u ov/Jachin Coury re:  Uacs/   cxr c/w "emphysema"  Chief Complaint  Patient presents with  . Follow-up    PFT done today. Cough is much improved.   Dyspnea:  One lap at pool then rest 15 secs and keep going to complete 30-40 min total  Cough: better to his satisfaction Sleeping: bed is flat two pillows  SABA use: none  02: none  rec For drainage / throat tickle try take CHLORPHENIRAMINE  4 mg  (Chlortab 4mg   at [employer] should be easiest to find in the green box)  take one every 4 hours as needed    We can call in gabapentin 100 mg three times a day if not making progress for diagnosis of irritable larynx syndrome    08/22/2019  f/u ov/Shalla Bulluck re: uacs hbp  Chief Complaint  Patient presents with  . Follow-up    Patient states that his cough and clearing of his throat have got better since  last visit. Patient is having issues with his BP and blood sugar was seen in the ED 08/20/2019.  Dyspnea: swimming fine ex tol Cough: much better, still some throat clearing , only using h1 at hs  Sleeping: fine flat one pillow SABA use: none 02: none  rec Double your telmisartan to 40 mg x 2 = 80 mg one daily  For drainage / throat tickle try take CHLORPHENIRAMINE  4 mg   Gabapentin 100 mg three times a day trial basis and see me in 6 weeks - if it makes you feel fuzzy headed then start with one daily x one week, then one twice daily x one week, then one three times a day until reutnr   GERD  Diet .  Follow up with your PCP before you run out of your telmesartan (micardis)       10/03/2019  f/u ov/Celeste Tavenner re:  UACS / HBP  Chief Complaint  Patient presents with  . Follow-up    Cough is much improved since the last visit but has not resolved completely.   Dyspnea: swimming 45 min daily s sob  Cough: snorting and throat clearing mostly now just  at hs but much better than prior to rx  Sleeping: on side bed is flat  SABA use: none 02: none  Chlorpheniramine 4 mg x 2  One hour before bedtime  Change micardis(telmasartan)  80-12.5 mg one daily  Gabapentin 100 mg  X 2  With supper and if night symptoms aren't better you will need to see ENT   Teoh eval done > rec nasal steroids @ hs   12/01/2019  f/u ov/Ronnica Dreese re: uacs/ hbp  Chief Complaint  Patient presents with  . Follow-up    Cough is much better, "pretty much gone".  Dyspnea:  Not limited by breathing from desired activities   Cough: still urge to clear his throat  Sleeping: fine once asleep on gabapentin 300mg  hs  SABA use: none  02: none    No obvious day to day or daytime variability or assoc excess/ purulent sputum or mucus plugs or hemoptysis or cp or chest tightness, subjective wheeze or overt sinus or hb symptoms.   Sleeping  without nocturnal  or early am exacerbation  of respiratory  c/o's or need for noct saba. Also  denies any obvious fluctuation of symptoms with weather or environmental changes or other aggravating or alleviating factors except as outlined above   No unusual exposure hx or h/o childhood pna/ asthma or knowledge of premature birth.  Current Allergies, Complete Past Medical History, Past Surgical History, Family History, and Social History were reviewed in [employer] record.  ROS  The following are not active complaints unless bolded Hoarseness, sore throat, dysphagia, dental problems, itching, sneezing,  nasal congestion or discharge of excess mucus or purulent secretions, ear ache,   fever, chills, sweats, unintended wt loss or wt gain, classically pleuritic or exertional cp,  orthopnea pnd or arm/hand swelling  or leg swelling, presyncope, palpitations, abdominal pain, anorexia, nausea, vomiting, diarrhea  or change in bowel habits or change in bladder habits, change in stools or change in urine, dysuria, hematuria,  rash, arthralgias, visual complaints, headache, numbness, weakness or ataxia or problems with walking or coordination,  change in mood or  memory.        Current Meds  Medication Sig  . ciclopirox (PENLAC) 8 % solution Apply 1 application topically daily as needed (fungus).   . ezetimibe-simvastatin (VYTORIN) 10-20 MG per tablet Take 1 tablet by mouth daily.  . famotidine (PEPCID) 20 MG tablet One after supper  . gabapentin (NEURONTIN) 300 MG capsule Take 1 capsule (300 mg total) by mouth 3 (three) times daily.  Marland Kitchen ipratropium (ATROVENT) 0.06 % nasal spray Place 2 sprays into both nostrils 2 (two) times daily.  . metFORMIN (GLUCOPHAGE) 500 MG tablet Take 500 mg by mouth 2 (two) times daily.   Glory Rosebush ULTRA test strip USE TO TEST ONCE DAILY ASEDIRECTED.TY  . pantoprazole (PROTONIX) 40 MG tablet Take 1 tablet (40 mg total) by mouth daily. Take 30-60 min before first meal of the day  . sertraline (ZOLOFT) 100 MG tablet Take one tablet daily.  . SURE  COMFORT PEN NEEDLES 32G X 4 MM MISC USE DAILY AS DIRECTEDO  . tamsulosin (FLOMAX) 0.4 MG CAPS capsule Take 1 capsule by mouth daily.  Marland Kitchen telmisartan-hydrochlorothiazide (MICARDIS HCT) 80-12.5 MG tablet Take 1 tablet by mouth daily.  Nelva Nay SOLOSTAR 300 UNIT/ML SOPN Inject 22 Units as directed at bedtime.                   Past Medical History:  Diagnosis Date  .  Anxiety   . Arthritis    bil knees  . Depression   . Diabetes mellitus without complication ([facility])        Objective:      12/01/2019        260  10/03/2019        259  08/22/2019         260   05/23/19 256 lb (116.1 kg)  04/11/19 257 lb 9.6 oz (116.8 kg)  02/18/19 256 lb 9.9 oz (116.4 kg)    amb obese wm nad  Vital signs reviewed  12/01/2019  - Note at rest 02 sats  98% on RA  - 160/80 before am med   HEENT : pt wearing mask not removed for exam due to covid -19 concerns.    NECK :  without JVD/Nodes/TM/ nl carotid upstrokes bilaterally   LUNGS: no acc muscle use,  Nl contour chest which is clear to A and P bilaterally without cough on insp or exp maneuvers   CV:  RRR  no s3 or murmur or increase in P2, and no edema   ABD:  Obese soft and nontender with nl inspiratory excursion in the supine position. No bruits or organomegaly appreciated, bowel sounds nl  MS:  Nl gait/ ext warm without deformities, calf tenderness, cyanosis or clubbing No obvious joint restrictions   SKIN: warm and dry without lesions    NEURO:  alert, approp, nl sensorium with  no motor or cerebellar deficits apparent.                 Assessment

## 2019-12-01 NOTE — Assessment & Plan Note (Signed)
Changed acei to arb 04/11/2019 for cough on early inspiration >improved to his satisfaction 05/23/2019    Although even in retrospect it may not be clear the ACEi contributed to the pt's symptoms,  Pt improved off them and adding them back at this point or in the future would risk confusion in interpretation of non-specific respiratory symptoms to which this patient is prone  ie  Better not to muddy the waters here.   >>> bp not optimally controlled but not taking telmasartan in am as rec and will f/u with pcp  F/u here in 3 m   Each maintenance medication was reviewed in detail including most importantly the difference between maintenance and as needed and under what circumstances the prns are to be used.  Please see AVS for specific  Instructions which are unique to this visit and I personally typed out  which were reviewed in detail in writing with the patient and a copy provided.

## 2019-12-24 ENCOUNTER — Other Ambulatory Visit: Payer: Self-pay | Admitting: Internal Medicine

## 2019-12-24 MED ORDER — TELMISARTAN-HCTZ 80-12.5 MG PO TABS: 1.0000 | ORAL_TABLET | Freq: Every day | ORAL | 3 refills | Status: DC

## 2019-12-24 MED ORDER — GABAPENTIN 300 MG PO CAPS: 300.0000 mg | ORAL_CAPSULE | Freq: Three times a day (TID) | ORAL | 3 refills | Status: DC

## 2020-01-07 ENCOUNTER — Telehealth: Payer: Self-pay | Admitting: Internal Medicine

## 2020-01-07 DIAGNOSIS — R058 Other specified cough: Secondary | ICD-10-CM

## 2020-01-07 MED ORDER — PANTOPRAZOLE SODIUM 40 MG PO TBEC: 40.0000 mg | DELAYED_RELEASE_TABLET | Freq: Every day | ORAL | 3 refills | Status: DC

## 2020-01-07 MED ORDER — FAMOTIDINE 20 MG PO TABS: ORAL_TABLET | ORAL | 3 refills | Status: DC

## 2020-01-07 NOTE — Telephone Encounter (Signed)
Rx for pt's pantoprazole and [facility] Called and spoke with pt letting him know this had been done and he verbalized understanding. Nothing further needed.

## 2020-03-25 ENCOUNTER — Other Ambulatory Visit: Payer: Self-pay

## 2020-03-25 ENCOUNTER — Ambulatory Visit (INDEPENDENT_AMBULATORY_CARE_PROVIDER_SITE_OTHER): Payer: POS | Admitting: Internal Medicine

## 2020-03-25 ENCOUNTER — Encounter: Payer: Self-pay | Admitting: Internal Medicine

## 2020-03-25 DIAGNOSIS — R05 Cough: Secondary | ICD-10-CM | POA: Diagnosis not present

## 2020-03-25 DIAGNOSIS — I10 Essential (primary) hypertension: Secondary | ICD-10-CM | POA: Diagnosis not present

## 2020-03-25 DIAGNOSIS — R058 Other specified cough: Secondary | ICD-10-CM

## 2020-03-25 MED ORDER — GABAPENTIN 300 MG PO CAPS: ORAL_CAPSULE | ORAL | Status: DC

## 2020-03-25 NOTE — Assessment & Plan Note (Signed)
Changed acei to arb 04/11/2019 for cough on early inspiration >improved to his satisfaction 05/23/2019    Adequate control on present rx, reviewed in detail with pt > no change in rx needed  > rx = micardis 80-12.5 one daily           Each maintenance medication was reviewed in detail including emphasizing most importantly the difference between maintenance and prns and under what circumstances the prns are to be triggered using an action plan format where appropriate.  Total time for H and P, chart review, counseling  and generating customized AVS unique to this office visit / charting = 15 min

## 2020-03-25 NOTE — Progress Notes (Signed)
Greg Oneill, male    DOB: 1956/02/09,    MRN: [id number]   Brief patient profile:  38 yowm smoked 15 years stopping in 1980 s obvious sequelae then started coughing early 40s esp at bedtime year round and referred to pulmonary clinic 04/11/2019 by  Greg Oneill for abn cxr suggesting copd done 03/17/2019     History of Present Illness  04/11/2019  Pulmonary/ 1st Oneill eval/Greg Oneill  - on ACEi for dm not hbp Chief Complaint  Patient presents with   Consult    Cough for last 15 - 20 years. Possible emphysema.  Dyspnea:  Can swim one lap then stops x 5 years / basketball ok but mostly just shooting hoops / no full court  Cough: dry esp at hs  Sleep: bed is flat  SABA use: none - ? Some better on symbicort  rec Stop lisinopril  And start micardis 40 mg one daily in its place For cough take tessalon 200mg  one every 6-8 hours  Pantoprazole (protonix) 40 mg   Take  30-60 min before first meal of the day and Pepcid (famotidine)  20 mg one @  bedtime until return to One[place] - this is the best way to tell whether stomach acid is contributing to your problem.   GERD diet      05/23/2019  f/u ov/Greg Oneill re:  Uacs/   cxr c/w "emphysema"  Chief Complaint  Patient presents with   Follow-up    PFT done today. Cough is much improved.   Dyspnea:  One lap at pool then rest 15 secs and keep going to complete 30-40 min total  Cough: better to his satisfaction Sleeping: bed is flat two pillows  SABA use: none  02: none  rec For drainage / throat tickle try take CHLORPHENIRAMINE  4 mg  (Chlortab 4mg   at [employer] should be easiest to find in the green box)  take one every 4 hours as needed    We can call in gabapentin 100 mg three times a day if not making progress for diagnosis of irritable larynx syndrome    08/22/2019  f/u ov/Greg Oneill re: uacs hbp  Chief Complaint  Patient presents with   Follow-up    Patient states that his cough and clearing of his throat have got better since  last visit. Patient is having issues with his BP and blood sugar was seen in the ED 08/20/2019.  Dyspnea: swimming fine ex tol Cough: much better, still some throat clearing , only using h1 at hs  Sleeping: fine flat one pillow SABA use: none 02: none  rec Double your telmisartan to 40 mg x 2 = 80 mg one daily  For drainage / throat tickle try take CHLORPHENIRAMINE  4 mg   Gabapentin 100 mg three times a day trial basis and see me in 6 weeks - if it makes you feel fuzzy headed then start with one daily x one week, then one twice daily x one week, then one three times a day until reutnr   GERD  Diet .  Follow up with your PCP before you run out of your telmesartan (micardis)       10/03/2019  f/u ov/Greg Oneill re:  UACS / HBP  Chief Complaint  Patient presents with   Follow-up    Cough is much improved since the last visit but has not resolved completely.   Dyspnea: swimming 45 min daily s sob  Cough: snorting and throat clearing mostly now just  at hs but much better than prior to rx  Sleeping: on side bed is flat  SABA use: none 02: none  Chlorpheniramine 4 mg x 2  One hour before bedtime  Change micardis(telmasartan)  80-12.5 mg one daily  Gabapentin 100 mg  X 2  With supper and if night symptoms aren't better you will need to see ENT   Greg Oneill eval done > rec nasal steroids @ hs   12/01/2019  f/u ov/Greg Oneill re: uacs/ hbp  Chief Complaint  Patient presents with   Follow-up    Cough is much better, "pretty much gone".  Dyspnea:  Not limited by breathing from desired activities   Cough: still urge to clear his throat  Sleeping: fine once asleep on gabapentin 300mg  hs  SABA use: none  02: none  rec Take blood pressure pill first thing in am (telmisartan) Try gabapentin 300 mg  Twice daily if it doesn't make you sleepy    03/25/2020  f/u ov/Greg Oneill/Greg Oneill re: uacs resolved on gerd rx/ off acei/ on gabapentin bid Chief Complaint  Patient presents with   Follow-up    no  complaints  Dyspnea:  Swimming a mile a day s sob  Cough: 99% better, no trouble in evening or hs on 300 bid  Sleeping: flat bed/ one pillow  SABA use: no 02: no   No obvious day to day or daytime variability or assoc excess/ purulent sputum or mucus plugs or hemoptysis or cp or chest tightness, subjective wheeze or overt sinus or hb symptoms.   sleeping without nocturnal  or early am exacerbation  of respiratory  c/o's or need for noct saba. Also denies any obvious fluctuation of symptoms with weather or environmental changes or other aggravating or alleviating factors except as outlined above   No unusual exposure hx or h/o childhood pna/ asthma or knowledge of premature birth.  Current Allergies, Complete Past Medical History, Past Surgical History, Family History, and Social History were reviewed in [employer] record.  ROS  The following are not active complaints unless bolded Hoarseness, sore throat, dysphagia, dental problems, itching, sneezing,  nasal congestion or discharge of excess mucus or purulent secretions, ear ache,   fever, chills, sweats, unintended wt loss or wt gain, classically pleuritic or exertional cp,  orthopnea pnd or arm/hand swelling  or leg swelling, presyncope, palpitations, abdominal pain, anorexia, nausea, vomiting, diarrhea  or change in bowel habits or change in bladder habits, change in stools or change in urine, dysuria, hematuria,  rash, arthralgias, visual complaints, headache, numbness, weakness or ataxia or problems with walking or coordination,  change in mood or  memory.        Current Meds  Medication Sig   ezetimibe-simvastatin (VYTORIN) 10-20 MG per tablet Take 1 tablet by mouth daily.   famotidine (PEPCID) 20 MG tablet One after supper   gabapentin (NEURONTIN) 300 MG capsule Take 1 capsule (300 mg total) by mouth 3 (three) times daily.   metFORMIN (GLUCOPHAGE) 500 MG tablet Take 500 mg by mouth 2 (two) times daily.     ONETOUCH ULTRA test strip USE TO TEST ONCE DAILY ASEDIRECTED.TY   pantoprazole (PROTONIX) 40 MG tablet Take 1 tablet (40 mg total) by mouth daily. Take 30-60 min before first meal of the day   sertraline (ZOLOFT) 100 MG tablet Take one tablet daily.   SURE COMFORT PEN NEEDLES 32G X 4 MM MISC USE DAILY AS DIRECTEDO   tamsulosin (FLOMAX) 0.4 MG CAPS capsule Take 1  capsule by mouth daily.   telmisartan-hydrochlorothiazide (MICARDIS HCT) 80-12.5 MG tablet Take 1 tablet by mouth daily.   TOUJEO SOLOSTAR 300 UNIT/ML SOPN Inject 22 Units as directed at bedtime.                    Past Medical History:  Diagnosis Date   Anxiety    Arthritis    bil knees   Depression    Diabetes mellitus without complication (HCC)        Objective:     03/25/2020         237  12/01/2019        260  10/03/2019        259  08/22/2019         260   05/23/19 256 lb (116.1 kg)  04/11/19 257 lb 9.6 oz (116.8 kg)  02/18/19 256 lb 9.9 oz (116.4 kg)      Vital signs reviewed  03/25/2020  - Note at rest 02 sats  96% on RA  And BP 130/82      HEENT : pt wearing mask not removed for exam due to covid -19 concerns.    NECK :  without JVD/Nodes/TM/ nl carotid upstrokes bilaterally   LUNGS: no acc muscle use,  Nl contour chest which is clear to A and P bilaterally without cough on insp or exp maneuvers   CV:  RRR  no s3 or murmur or increase in P2, and no edema   ABD: mod obese/  soft and nontender with nl inspiratory excursion in the supine position. No bruits or organomegaly appreciated, bowel sounds nl  MS:  Nl gait/ ext warm without deformities, calf tenderness, cyanosis or clubbing No obvious joint restrictions   SKIN: warm and dry without lesions    NEURO:  alert, approp, nl sensorium with  no motor or cerebellar deficits apparent.        Assessment

## 2020-03-25 NOTE — Patient Instructions (Addendum)
Try off the pm dose of gabapentin x couple of weeks then ok  stop the pm dose of pepcid (famotidine)   For drainage / throat tickle try take CHLORPHENIRAMINE  4 mg  (Chlortab 4mg   at [employer] should be easiest to find in the green box)  take one every 4 hours as needed - available over the counter- may cause drowsiness so start with just a bedtime dose or two and see how you tolerate it before trying in daytime     Please schedule a follow up visit in 3 months but call sooner if needed

## 2020-03-25 NOTE — Assessment & Plan Note (Signed)
Onset was around 2000 - IgE 04/11/2019 =   / Eos 0.1  IgE 133 - 04/11/2019 d/c acei > 05/23/2019  Reported marked improvement  But still some sense of pnds > rec 1st gen H1 blockers per guidelines    - 08/22/2019 added gabapentin titrating up to 100 tid  - 10/03/2019 increased pm  gabapentin and 1st gen H1 blockers per guidelines  For sensation of pnds and if not better rec ent eval - [facility] eval rx nasal steroids  - improved on  h1 x 2 at hs plus gabapentin 300 mg qhs 12/01/2019 so try 300 mg bid if tol  - 03/25/2020 decreased gabapentin to 300 mg q am / d/c'd pm pepcid   Since 99% better cough and none p supper or hs ok to stop the pm gabapentin then the pm pepcid and return in 3 m for ? Try off gerd rx altogether.

## 2020-04-27 ENCOUNTER — Other Ambulatory Visit: Payer: Self-pay

## 2020-04-27 ENCOUNTER — Encounter: Payer: Self-pay | Admitting: Adult Health

## 2020-04-27 ENCOUNTER — Ambulatory Visit (INDEPENDENT_AMBULATORY_CARE_PROVIDER_SITE_OTHER): Payer: POS | Admitting: Adult Health

## 2020-04-27 DIAGNOSIS — F429 Obsessive-compulsive disorder, unspecified: Secondary | ICD-10-CM

## 2020-04-27 DIAGNOSIS — F411 Generalized anxiety disorder: Secondary | ICD-10-CM

## 2020-04-27 MED ORDER — SERTRALINE HCL 100 MG PO TABS: ORAL_TABLET | ORAL | 3 refills | Status: DC

## 2020-04-27 NOTE — Progress Notes (Signed)
Greg Oneill [id number] 09-01-1955 64 y.o.  Subjective:   Patient ID:  Greg Oneill is a 64 y.o. (DOB [birth year]) male.  Chief Complaint: No chief complaint on file.   HPI   Greg Oneill presents to the office today for follow-up of GAD, MDD, and OCD.  Describes mood today as "ok". Pleasant. Mood symptoms - denies depression, anxiety, and irritability. Stating "I'm doing so much better". Feels like Zoloft continues to work well for him. Stable interest and motivation. Taking medications as prescribed.  Energy levels stable. Active, has a regular exercise routine. Swimming 7 days a week. Golfing 6 to 7 days a week.  Enjoys some usual interests and activities. Married. Lives with wife of 39 years. Has 3 grown sons. 1 granddaughter. Spending time with family. Appetite adequate. Weight loss - 238 - type 2 diabetic - A1C 7.  Sleeps well most nights. Averages 7 to 8 hours. Can take a while to get to sleep. Focus and concentration stable. Completing tasks. Managing aspects of household. Retired from post office in 2018-32 years. Denies SI or HI. Denies AH or VH.  Previous medication trials: Lexapro, Wellbutrin, Prozac, Paxil  Review of Systems:  Review of Systems  Musculoskeletal: Negative for gait problem.  Neurological: Negative for tremors.  Psychiatric/Behavioral:       Please refer to HPI    Medications: I have reviewed the patient's current medications.  Current Outpatient Medications  Medication Sig Dispense Refill  . ezetimibe-simvastatin (VYTORIN) 10-20 MG per tablet Take 1 tablet by mouth daily.    Marland Kitchen gabapentin (NEURONTIN) 300 MG capsule One daily in am    . metFORMIN (GLUCOPHAGE) 500 MG tablet Take 500 mg by mouth 2 (two) times daily.   0  . ONETOUCH ULTRA test strip USE TO TEST ONCE DAILY ASEDIRECTED.TY    . pantoprazole (PROTONIX) 40 MG tablet Take 1 tablet (40 mg total) by mouth daily. Take 30-60 min before first meal of the day 90 tablet 3  .  sertraline (ZOLOFT) 100 MG tablet Take one tablet daily. 90 tablet 3  . SURE COMFORT PEN NEEDLES 32G X 4 MM MISC USE DAILY AS DIRECTEDO    . tamsulosin (FLOMAX) 0.4 MG CAPS capsule Take 1 capsule by mouth daily.    Marland Kitchen telmisartan-hydrochlorothiazide (MICARDIS HCT) 80-12.5 MG tablet Take 1 tablet by mouth daily. 30 tablet 3  . TOUJEO SOLOSTAR 300 UNIT/ML SOPN Inject 22 Units as directed at bedtime.     No current facility-administered medications for this visit.    Medication Side Effects: None  Allergies: No Known Allergies  Past Medical History:  Diagnosis Date  . Anxiety   . Arthritis    bil knees  . Depression   . Diabetes mellitus without complication ([place])     Family History  Problem Relation Age of Onset  . Bladder Cancer Father   . Parkinson's disease Brother   . Colon cancer Neg Hx   . Colon polyps Neg Hx     Social History   Socioeconomic History  . Marital status: Married    Spouse name: Not on file  . Number of children: Not on file  . Years of education: Not on file  . Highest education level: Not on file  Occupational History  . Not on file  Tobacco Use  . Smoking status: Former Smoker    Packs/day: 1.00    Years: 14.00    Pack years: 14.00    Types: Cigarettes    Quit date:  07/17/1978    Years since quitting: 41.8  . Smokeless tobacco: Former Network engineer  . Vaping Use: Never used  Substance and Sexual Activity  . Alcohol use: No  . Drug use: No  . Sexual activity: Not on file  Other Topics Concern  . Not on file  Social History Narrative  . Not on file   Social Determinants of Health   Financial Resource Strain:   . Difficulty of Paying Living Expenses: Not on file  Food Insecurity:   . Worried About Charity fundraiser in the Last Year: Not on file  . Ran Out of Food in the Last Year: Not on file  Transportation Needs:   . Lack of Transportation (Medical): Not on file  . Lack of Transportation (Non-Medical): Not on file  Physical  Activity:   . Days of Exercise per Week: Not on file  . Minutes of Exercise per Session: Not on file  Stress:   . Feeling of Stress : Not on file  Social Connections:   . Frequency of Communication with Friends and Family: Not on file  . Frequency of Social Gatherings with Friends and Family: Not on file  . Attends Religious Services: Not on file  . Active Member of Clubs or Organizations: Not on file  . Attends Archivist Meetings: Not on file  . Marital Status: Not on file  Intimate Partner Violence:   . Fear of Current or Ex-Partner: Not on file  . Emotionally Abused: Not on file  . Physically Abused: Not on file  . Sexually Abused: Not on file    Past Medical History, Surgical history, Social history, and Family history were reviewed and updated as appropriate.   Please see review of systems for further details on the patient's review from today.   Objective:   Physical Exam:  There were no vitals taken for this visit.  Physical Exam Constitutional:      General: He is not in acute distress. Musculoskeletal:        General: No deformity.  Neurological:     Mental Status: He is alert and oriented to person, place, and time.     Coordination: Coordination normal.  Psychiatric:        Attention and Perception: Attention and perception normal. He does not perceive auditory or visual hallucinations.        Mood and Affect: Mood normal. Mood is not anxious or depressed. Affect is not labile, blunt, angry or inappropriate.        Speech: Speech normal.        Behavior: Behavior normal.        Thought Content: Thought content normal. Thought content is not paranoid or delusional. Thought content does not include homicidal or suicidal ideation. Thought content does not include homicidal or suicidal plan.        Cognition and Memory: Cognition and memory normal.        Judgment: Judgment normal.     Comments: Insight intact     Lab Review:     Component Value  Date/Time   NA 134 (L) 02/14/2019 1013   K 4.4 02/14/2019 1013   CL 100 02/14/2019 1013   CO2 24 02/14/2019 1013   GLUCOSE 151 (H) 02/14/2019 1013   BUN 12 02/14/2019 1013   CREATININE 1.07 02/14/2019 1013   CALCIUM 8.8 (L) 02/14/2019 1013   PROT 6.5 02/14/2019 1013   ALBUMIN 4.0 02/14/2019 1013   AST 37 02/14/2019  1013   ALT 29 02/14/2019 1013   ALKPHOS 67 02/14/2019 1013   BILITOT 1.8 (H) 02/14/2019 1013   GFRNONAA >60 02/14/2019 1013   GFRAA >60 02/14/2019 1013       Component Value Date/Time   WBC 6.7 04/11/2019 1507   RBC 4.76 04/11/2019 1507   HGB 15.3 04/11/2019 1507   HCT 44.1 04/11/2019 1507   PLT 203.0 04/11/2019 1507   MCV 92.7 04/11/2019 1507   MCH 31.1 02/14/2019 1013   MCHC 34.6 04/11/2019 1507   RDW 14.1 04/11/2019 1507   LYMPHSABS 1.5 04/11/2019 1507   MONOABS 0.5 04/11/2019 1507   EOSABS 0.1 04/11/2019 1507   BASOSABS 0.0 04/11/2019 1507    No results found for: POCLITH, LITHIUM   No results found for: PHENYTOIN, PHENOBARB, VALPROATE, CBMZ   .res Assessment: Plan:     Plan:  PDMP reviewed  1.  Zoloft 100mg  daily  RTC 1 year  Patient advised to contact office with any questions, adverse effects, or acute worsening in signs and symptoms.    Diagnoses and all orders for this visit:  Generalized anxiety disorder -     sertraline (ZOLOFT) 100 MG tablet; Take one tablet daily.  Obsessive-compulsive disorder, unspecified type -     sertraline (ZOLOFT) 100 MG tablet; Take one tablet daily.     Please see After Visit Summary for patient specific instructions.  No future appointments.  No orders of the defined types were placed in this encounter.   -------------------------------

## 2020-09-29 ENCOUNTER — Other Ambulatory Visit: Payer: Self-pay

## 2020-09-29 ENCOUNTER — Encounter: Payer: Self-pay | Admitting: Emergency Medicine

## 2020-09-29 ENCOUNTER — Ambulatory Visit (INDEPENDENT_AMBULATORY_CARE_PROVIDER_SITE_OTHER): Payer: POS

## 2020-09-29 ENCOUNTER — Ambulatory Visit
Admission: EM | Admit: 2020-09-29 | Discharge: 2020-09-29 | Disposition: A | Discharge status: Home / Self Care | Payer: POS | Attending: Family Medicine | Admitting: Family Medicine

## 2020-09-29 DIAGNOSIS — M79645 Pain in left finger(s): Secondary | ICD-10-CM

## 2020-09-29 DIAGNOSIS — Y9353 Activity, golf: Secondary | ICD-10-CM

## 2020-09-29 NOTE — ED Triage Notes (Signed)
Patient states he was swinging a golf club today and felt something pop and immediate pain in left ring finger.

## 2020-09-30 NOTE — ED Provider Notes (Signed)
[facility]   [id number] 09/29/20 Arrival Time: 1803  ASSESSMENT & PLAN:  1. Pain of finger of left hand     I have personally viewed the imaging studies ordered this visit. No acute fx or dislocation appreciated.  DG Finger Ring Left  Result Date: 09/29/2020 CLINICAL DATA:  Left fourth finger pain after injury playing golf. EXAM: LEFT RING FINGER 2+V COMPARISON:  None. FINDINGS: There is no definite evidence of acute fracture or dislocation. There is no evidence of arthropathy. Well corticated bone density is seen adjacent to the fourth proximal interphalangeal joint most consistent with old fracture. Soft tissues are unremarkable. IMPRESSION: No definite evidence of acute fracture or dislocation. Probable old fracture is seen adjacent to the fourth proximal interphalangeal joint. Electronically Signed   By: Marijo Conception M.D.   On: 09/29/2020 18:37   Prefers observation, ice, ibuprofen. Declines finger splint.  Recommend:  Follow-up Information    Roseanne Kaufman, MD.   Specialty: Orthopedic Surgery Why: If worsening or failing to improve as anticipated. Contact information: [address] [place] [address] [phone]                Reviewed expectations re: course of current medical issues. Questions answered. Outlined signs and symptoms indicating need for more acute intervention. Patient verbalized understanding. After Visit Summary given.  SUBJECTIVE: History from: patient. Greg Oneill is a 65 y.o. male who reports L 4th finger pain while swinging golf club today; no direct trauma. Questions mild swelling. No extremity sensation changes or weakness. No OTC tx. Questions old fx of finger. Having trouble fully flexing finger.    Past Surgical History:  Procedure Laterality Date  . COLONOSCOPY N/A 09/24/2017   Procedure: COLONOSCOPY;  Surgeon: Danie Binder, MD;  [facility];  Service: Endoscopy;   Laterality: N/A;  10:30  . CYST EXCISION N/A 02/18/2019   Procedure: EXCISION CYST POSTERIOR NECK AND UPPER BACK;  Surgeon: Erroll Luna, MD;  Location: [facility];  Service: General;  Laterality: N/A;  . INSERTION OF MESH N/A 08/10/2014   Procedure: INSERTION OF MESH;  Surgeon: Jamesetta So, MD;  Location: [facility];  Service: General;  Laterality: N/A;  . PILONIDAL CYST EXCISION  1977   Dr Marnette Burgess  . POLYPECTOMY  09/24/2017   Procedure: POLYPECTOMY;  Surgeon: Danie Binder, MD;  [facility];  Service: Endoscopy;;  colon  . UMBILICAL HERNIA REPAIR N/A 08/10/2014   Procedure: UMBILICAL HERNIORRHAPHY ;  Surgeon: Jamesetta So, MD;  Location: [facility];  Service: General;  Laterality: N/A;      OBJECTIVE:  Vitals:   09/29/20 1812  BP: 135/84  Pulse: 77  Resp: 18  Temp: 97.9 F (36.6 C)  TempSrc: Oral  SpO2: 95%    General appearance: alert; no distress HEENT: [place]; AT Neck: supple with FROM Resp: unlabored respirations Extremities: . L 4th finger with mild to mod TTP over PIP joint; minimal swelling; trouble fully flexing finger; normal extension; normal cap refill and distal sensation Skin: warm and dry; no visible rashes Psychological: alert and cooperative; normal mood and affect    No Known Allergies  Past Medical History:  Diagnosis Date  . Anxiety   . Arthritis    bil knees  . Depression   . Diabetes mellitus without complication [facility])    Social History   Socioeconomic History  . Marital status: Married    Spouse name: Not on file  .  Number of children: Not on file  . Years of education: Not on file  . Highest education level: Not on file  Occupational History  . Not on file  Tobacco Use  . Smoking status: Former Smoker    Packs/day: 1.00    Years: 14.00    Pack years: 14.00    Types: Cigarettes    Quit date: 07/17/1978    Years since quitting: 42.2  . Smokeless tobacco: Former Network engineer  . Vaping Use: Never used   Substance and Sexual Activity  . Alcohol use: No  . Drug use: No  . Sexual activity: Not on file  Other Topics Concern  . Not on file  Social History Narrative  . Not on file   Social Determinants of Health   Financial Resource Strain: Not on file  Food Insecurity: Not on file  Transportation Needs: Not on file  Physical Activity: Not on file  Stress: Not on file  Social Connections: Not on file   Family History  Problem Relation Age of Onset  . Bladder Cancer Father   . Parkinson's disease Brother   . Colon cancer Neg Hx   . Colon polyps Neg Hx    Past Surgical History:  Procedure Laterality Date  . COLONOSCOPY N/A 09/24/2017   Procedure: COLONOSCOPY;  Surgeon: Danie Binder, MD;  [facility];  Service: Endoscopy;  Laterality: N/A;  10:30  . CYST EXCISION N/A 02/18/2019   Procedure: EXCISION CYST POSTERIOR NECK AND UPPER BACK;  Surgeon: Erroll Luna, MD;  Location: [facility];  Service: General;  Laterality: N/A;  . INSERTION OF MESH N/A 08/10/2014   Procedure: INSERTION OF MESH;  Surgeon: Jamesetta So, MD;  Location[facility];  Service: General;  Laterality: N/A;  . PILONIDAL CYST EXCISION  1977   Dr Marnette Burgess  . POLYPECTOMY  09/24/2017   Procedure: POLYPECTOMY;  Surgeon: Danie Binder, MD;  [facility];  Service: Endoscopy;;  colon  . UMBILICAL HERNIA REPAIR N/A 08/10/2014   Procedure: UMBILICAL HERNIORRHAPHY ;  Surgeon: Jamesetta So, MD;  Location: [facility];  Service: General;  Laterality: Ginette Pitman, MD 09/30/20 [phone]

## 2020-10-09 IMAGING — DX DG CHEST 2V
2 series · 2 of 2 positions shown · non-contrast
Comparison: 10/13/2015

CLINICAL DATA: Cough, history of smoking

EXAM:
CHEST - 2 VIEW

[chest pa]
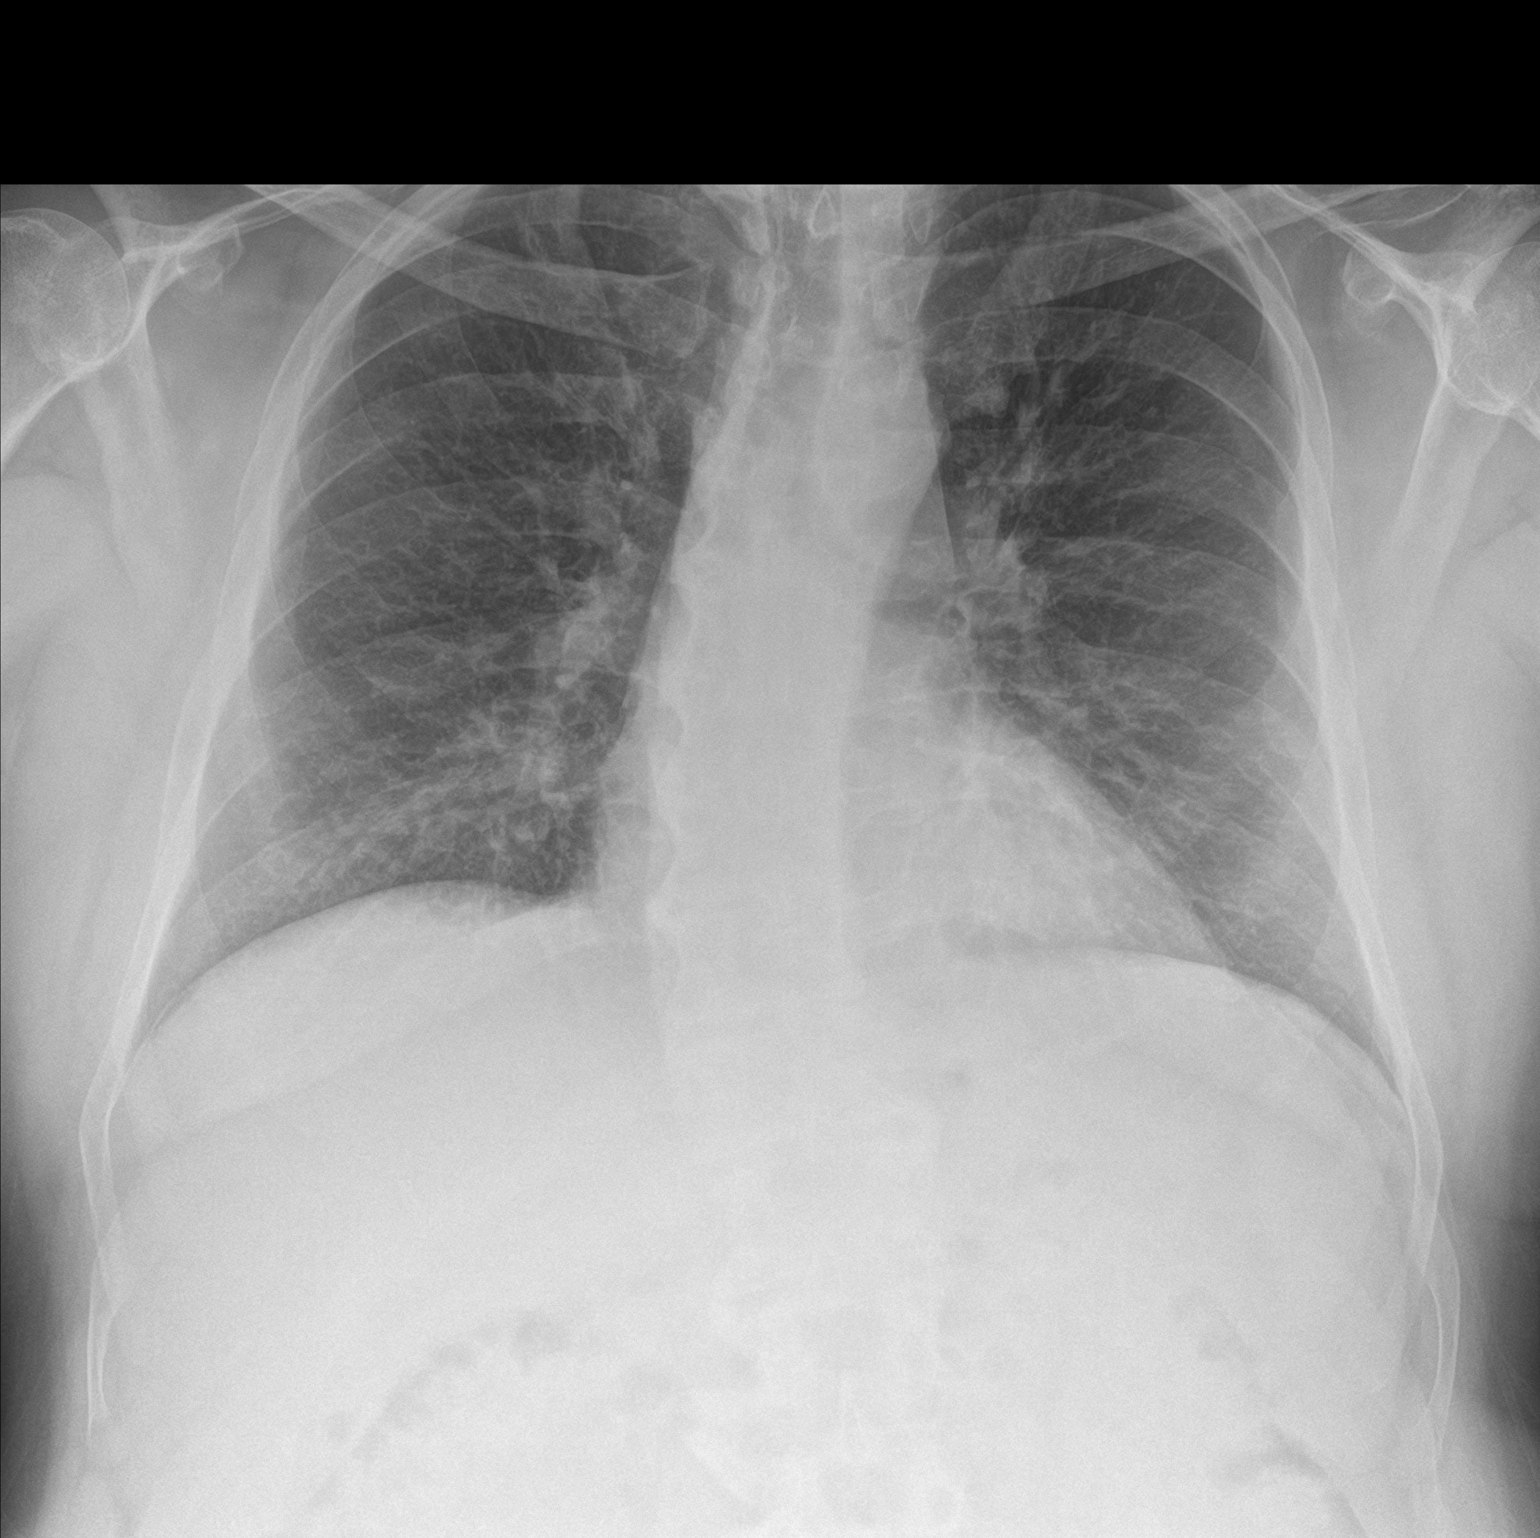

[chest lat]
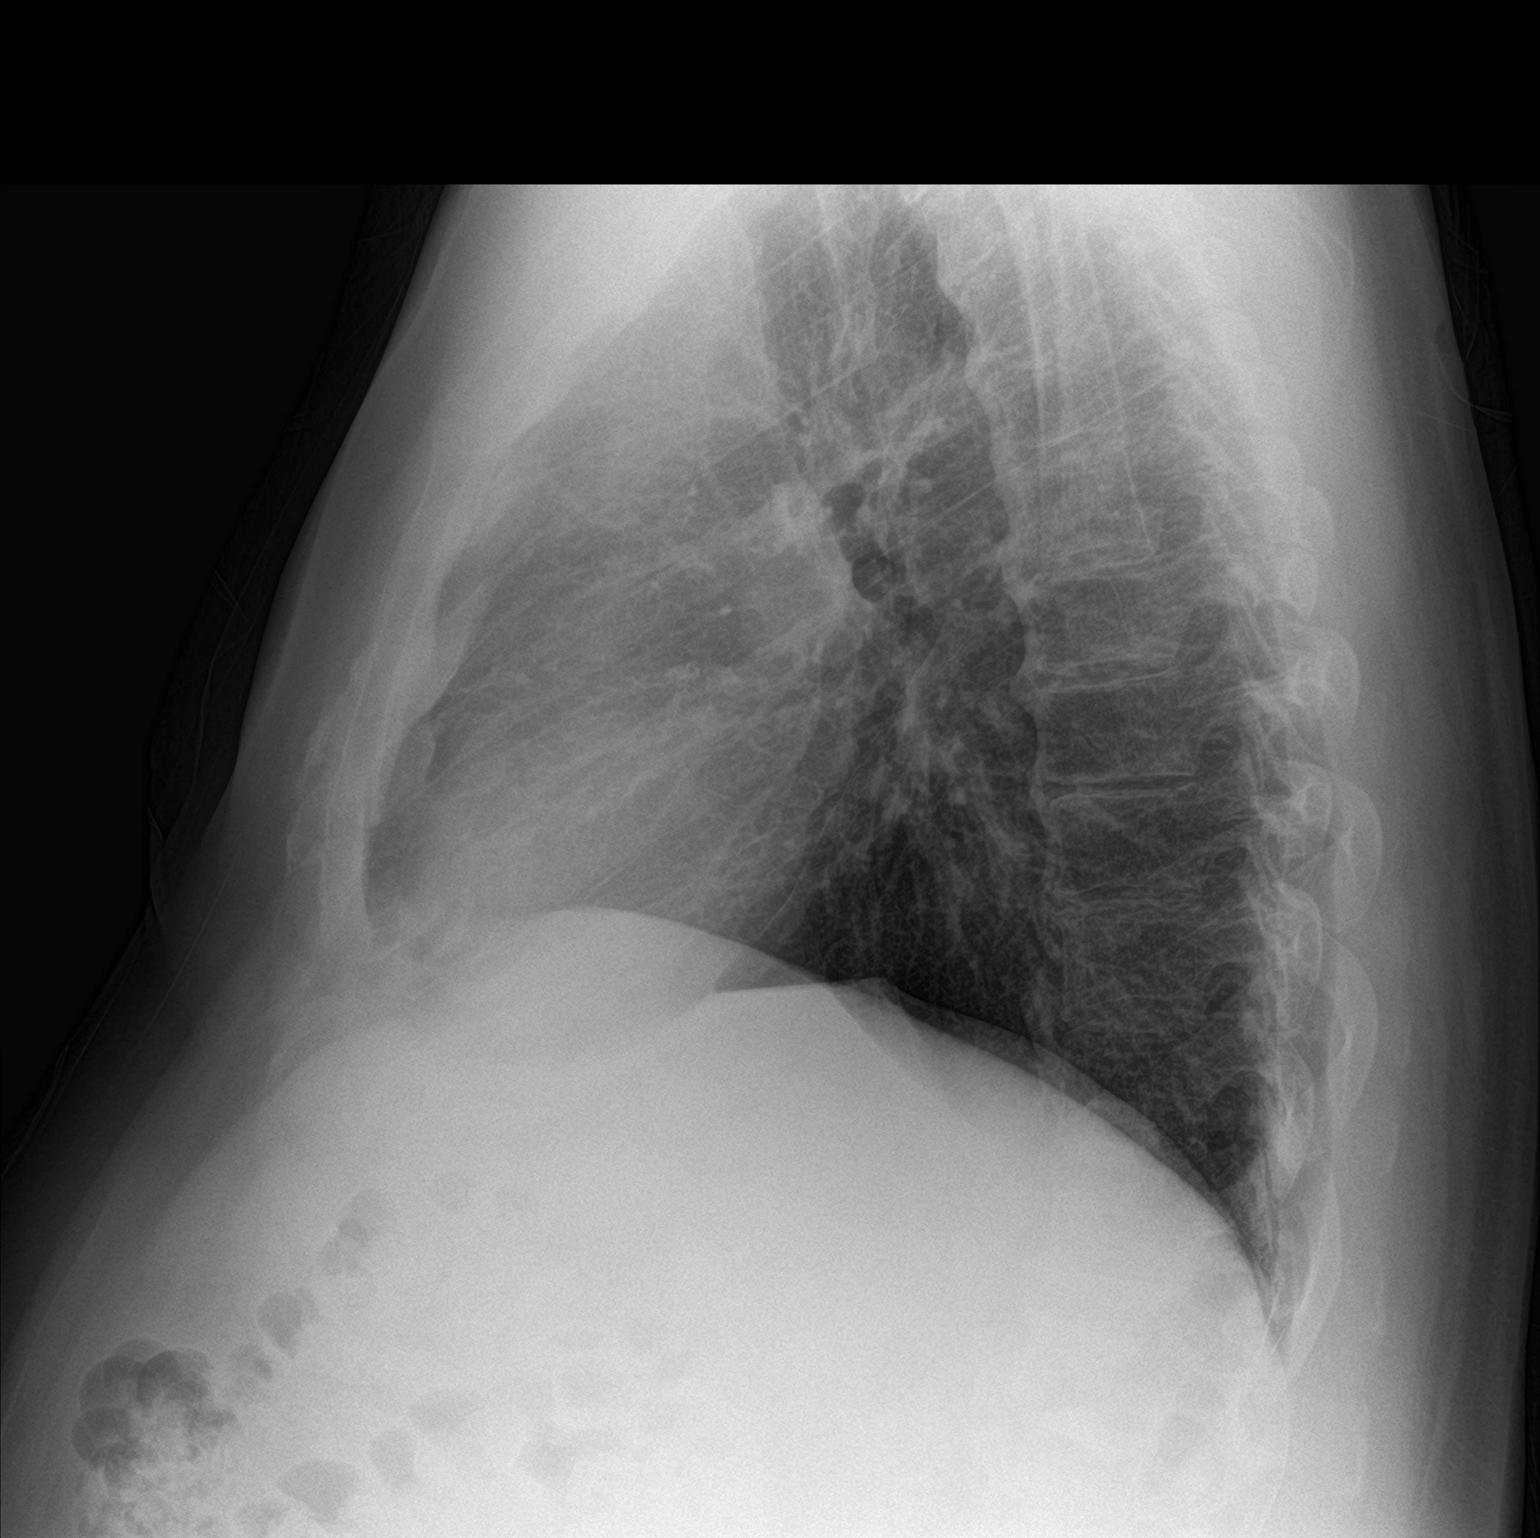

[2 of 2 positions shown; findings below may reference images not displayed]

FINDINGS: The heart size and mediastinal contours are within normal limits.
Probable emphysematous change. Dextroscoliosis of the thoracic spine
with associated disc degenerative disease and ankylosis.
IMPRESSION: Probable emphysema without acute abnormality of the lungs. No focal
airspace opacity.

## 2020-11-17 ENCOUNTER — Telehealth: Payer: Self-pay | Admitting: Adult Health

## 2020-11-17 ENCOUNTER — Other Ambulatory Visit: Payer: Self-pay

## 2020-11-17 DIAGNOSIS — F411 Generalized anxiety disorder: Secondary | ICD-10-CM

## 2020-11-17 DIAGNOSIS — F429 Obsessive-compulsive disorder, unspecified: Secondary | ICD-10-CM

## 2020-11-17 MED ORDER — SERTRALINE HCL 100 MG PO TABS: ORAL_TABLET | ORAL | 0 refills | Status: DC

## 2020-11-17 NOTE — Telephone Encounter (Signed)
sent 

## 2020-11-17 NOTE — Telephone Encounter (Signed)
Yes

## 2020-11-17 NOTE — Telephone Encounter (Signed)
Ok to send

## 2020-11-17 NOTE — Telephone Encounter (Signed)
Pt needs a 5 day supply of sertraline 100 mg to be sent to the [facility] in [place] on [address]. He is waiting for his mail order to arrive.

## 2021-04-26 ENCOUNTER — Ambulatory Visit: Payer: Medicare Other | Admitting: Adult Health

## 2021-05-09 ENCOUNTER — Other Ambulatory Visit: Payer: Self-pay | Admitting: Adult Health

## 2021-05-09 DIAGNOSIS — F429 Obsessive-compulsive disorder, unspecified: Secondary | ICD-10-CM

## 2021-05-09 DIAGNOSIS — F411 Generalized anxiety disorder: Secondary | ICD-10-CM

## 2021-05-10 NOTE — Telephone Encounter (Signed)
Pt asks if you are willing to increase the Zoloft RX to 150mg   per day so he can see if that will help him. There is the RF for 100mg , so if okay can we let him know and then send in the new dose?

## 2021-05-10 NOTE — Telephone Encounter (Signed)
That is fine 

## 2021-05-10 NOTE — Telephone Encounter (Signed)
Please review

## 2021-05-11 NOTE — Telephone Encounter (Signed)
Ok to send

## 2021-05-17 ENCOUNTER — Telehealth: Payer: Self-pay | Admitting: Adult Health

## 2021-05-17 ENCOUNTER — Other Ambulatory Visit: Payer: Self-pay

## 2021-05-17 DIAGNOSIS — F411 Generalized anxiety disorder: Secondary | ICD-10-CM

## 2021-05-17 DIAGNOSIS — F429 Obsessive-compulsive disorder, unspecified: Secondary | ICD-10-CM

## 2021-05-17 MED ORDER — SERTRALINE HCL 100 MG PO TABS: 150.0000 mg | ORAL_TABLET | Freq: Every day | ORAL | 0 refills | Status: DC

## 2021-05-17 NOTE — Telephone Encounter (Signed)
Can you please call the pharmacy?

## 2021-05-17 NOTE — Telephone Encounter (Signed)
30-day supply sent to local pharmacy. Rx sent to [facility] was written incorrectly. Will call to clarify and send new Rx if necessary.

## 2021-05-17 NOTE — Telephone Encounter (Signed)
90 day supply with clarification of Sertraline 100 mg 1.5 tabs daily sent to [facility].

## 2021-05-17 NOTE — Telephone Encounter (Signed)
Pt called stating [facility] is waiting on clarification on Rx sent 10/26. Advised Pt waiting to hear from provider. Please contact [facility]. Pt takes 1.5 tab= 150 mg of Sertraline  100 mg

## 2021-05-17 NOTE — Telephone Encounter (Signed)
A new Rx was sent to [facility] but the Rx was for 100 mg tablets to take 1.5 for a total of 150 daily, but then stated to take 1 tablet. Patient stated pharmacy was waiting on clarification from [place].  Patient asking for Rx sent to local pharmacy for 30-day supply. That will be done and a corrected Rx will be sent to [facility].

## 2021-05-17 NOTE — Telephone Encounter (Addendum)
Pt called completely out and asking for 30 day of the Sertraline 150 mg to local [facility] Dr Linna Hoff. Apt 11/9 Has not yet received mail order.

## 2021-05-18 NOTE — Telephone Encounter (Signed)
Traci has fax from mail order pharmacy to send in a corrected Rx.

## 2021-05-19 ENCOUNTER — Encounter: Payer: Self-pay | Admitting: *Deleted

## 2021-05-25 ENCOUNTER — Other Ambulatory Visit: Payer: Self-pay

## 2021-05-25 ENCOUNTER — Encounter: Payer: Self-pay | Admitting: Adult Health

## 2021-05-25 ENCOUNTER — Ambulatory Visit (INDEPENDENT_AMBULATORY_CARE_PROVIDER_SITE_OTHER): Payer: POS | Admitting: Adult Health

## 2021-05-25 DIAGNOSIS — F331 Major depressive disorder, recurrent, moderate: Secondary | ICD-10-CM

## 2021-05-25 DIAGNOSIS — F429 Obsessive-compulsive disorder, unspecified: Secondary | ICD-10-CM

## 2021-05-25 DIAGNOSIS — F411 Generalized anxiety disorder: Secondary | ICD-10-CM

## 2021-05-25 MED ORDER — SERTRALINE HCL 100 MG PO TABS: 150.0000 mg | ORAL_TABLET | Freq: Every day | ORAL | 3 refills | Status: DC

## 2021-05-25 NOTE — Progress Notes (Signed)
Greg Oneill [id number] 07-14-1956 65 y.o.  Subjective:   Patient ID:  Greg Oneill is a 65 y.o. (DOB 05-Jan-1956) male.  Chief Complaint: No chief complaint on file.   HPI Greg Oneill presents to the office today for follow-up of GAD, MDD, and OCD.  Describes mood today as "ok". Pleasant. Mood symptoms - denies depression, anxiety, and irritability. Stating "I'm doing pretty good". Decreased worry and rumination. Stating "I am feeling pretty good". Feels like Zoloft continues to work well for him - now taking 150mg  daily. Stable interest and motivation. Taking medications as prescribed.  Energy levels stable. Active, has a regular exercise routine. Swimming 7 days a week. Golfing 6 to 7 days a week.  Enjoys some usual interests and activities. Married. Lives with wife of 16 years. Has 3 grown sons. 1 granddaughter. Spending time with family. Appetite adequate. Weight loss - 238 - type 2 diabetic - working on getting A1C down. Eating a low carb diet. Sleeps well most nights. Averages 7 to 8 hours. Can take a while to get to sleep. Focus and concentration stable. Completing tasks. Managing aspects of household. Retired from post office in 2018-32 years. Denies SI or HI.  Denies AH or VH.  Previous medication trials: Lexapro, Wellbutrin, Prozac, Paxil    Flowsheet Row ED from 09/29/2020 in [facility] [place] No Risk        Review of Systems:  Review of Systems  Musculoskeletal:  Negative for gait problem.  Neurological:  Negative for tremors.  Psychiatric/Behavioral:         Please refer to HPI   Medications: I have reviewed the patient's current medications.  Current Outpatient Medications  Medication Sig Dispense Refill   ezetimibe-simvastatin (VYTORIN) 10-20 MG per tablet Take 1 tablet by mouth daily.     ONETOUCH ULTRA test strip USE TO TEST ONCE DAILY ASEDIRECTED.TY     sertraline (ZOLOFT) 100 MG tablet Take  1.5 tablets (150 mg total) by mouth daily. 45 tablet 0   sertraline (ZOLOFT) 100 MG tablet Take 1.5 tablets (150 mg total) by mouth daily. 135 tablet 3   SURE COMFORT PEN NEEDLES 32G X 4 MM MISC USE DAILY AS DIRECTEDO     tamsulosin (FLOMAX) 0.4 MG CAPS capsule Take 1 capsule by mouth daily.     TOUJEO SOLOSTAR 300 UNIT/ML SOPN Inject 22 Units as directed at bedtime.     No current facility-administered medications for this visit.    Medication Side Effects: None  Allergies: No Known Allergies  Past Medical History:  Diagnosis Date   Anxiety    Arthritis    bil knees   Depression    Diabetes mellitus without complication ([place])     Past Medical History, Surgical history, Social history, and Family history were reviewed and updated as appropriate.   Please see review of systems for further details on the patient's review from today.   Objective:   Physical Exam:  There were no vitals taken for this visit.  Physical Exam Constitutional:      General: He is not in acute distress. Musculoskeletal:        General: No deformity.  Neurological:     Mental Status: He is alert and oriented to person, place, and time.     Coordination: Coordination normal.  Psychiatric:        Attention and Perception: Attention and perception normal. He does not perceive auditory or visual hallucinations.  Mood and Affect: Mood normal. Mood is not anxious or depressed. Affect is not labile, blunt, angry or inappropriate.        Speech: Speech normal.        Behavior: Behavior normal.        Thought Content: Thought content normal. Thought content is not paranoid or delusional. Thought content does not include homicidal or suicidal ideation. Thought content does not include homicidal or suicidal plan.        Cognition and Memory: Cognition and memory normal.        Judgment: Judgment normal.     Comments: Insight intact    Lab Review:     Component Value Date/Time   NA 134 (L)  02/14/2019 1013   K 4.4 02/14/2019 1013   CL 100 02/14/2019 1013   CO2 24 02/14/2019 1013   GLUCOSE 151 (H) 02/14/2019 1013   BUN 12 02/14/2019 1013   CREATININE 1.07 02/14/2019 1013   CALCIUM 8.8 (L) 02/14/2019 1013   PROT 6.5 02/14/2019 1013   ALBUMIN 4.0 02/14/2019 1013   AST 37 02/14/2019 1013   ALT 29 02/14/2019 1013   ALKPHOS 67 02/14/2019 1013   BILITOT 1.8 (H) 02/14/2019 1013   GFRNONAA >60 02/14/2019 1013   GFRAA >60 02/14/2019 1013       Component Value Date/Time   WBC 6.7 04/11/2019 1507   RBC 4.76 04/11/2019 1507   HGB 15.3 04/11/2019 1507   HCT 44.1 04/11/2019 1507   PLT 203.0 04/11/2019 1507   MCV 92.7 04/11/2019 1507   MCH 31.1 02/14/2019 1013   MCHC 34.6 04/11/2019 1507   RDW 14.1 04/11/2019 1507   LYMPHSABS 1.5 04/11/2019 1507   MONOABS 0.5 04/11/2019 1507   EOSABS 0.1 04/11/2019 1507   BASOSABS 0.0 04/11/2019 1507    No results found for: POCLITH, LITHIUM   No results found for: PHENYTOIN, PHENOBARB, VALPROATE, CBMZ   .res Assessment: Plan:     Plan:  PDMP reviewed  1.  Zoloft 150mg  daily  RTC 1 year  Patient advised to contact office with any questions, adverse effects, or acute worsening in signs and symptoms.  Diagnoses and all orders for this visit:  Generalized anxiety disorder -     sertraline (ZOLOFT) 100 MG tablet; Take 1.5 tablets (150 mg total) by mouth daily.  Obsessive-compulsive disorder, unspecified type -     sertraline (ZOLOFT) 100 MG tablet; Take 1.5 tablets (150 mg total) by mouth daily.  Major depressive disorder, recurrent episode, moderate (HCC) -     sertraline (ZOLOFT) 100 MG tablet; Take 1.5 tablets (150 mg total) by mouth daily.    Please see After Visit Summary for patient specific instructions.  Future Appointments  Date Time Provider [place]  06/15/2021  8:00 AM RGA-RGA NURSE RGA-RGA RGA    No orders of the defined types were placed in this  encounter.   -------------------------------

## 2021-06-15 ENCOUNTER — Other Ambulatory Visit: Payer: Self-pay

## 2021-06-15 ENCOUNTER — Ambulatory Visit (INDEPENDENT_AMBULATORY_CARE_PROVIDER_SITE_OTHER): Payer: Self-pay | Admitting: *Deleted

## 2021-06-15 VITALS — Ht 72.0 in | Wt 247.2 lb

## 2021-06-15 DIAGNOSIS — Z86010 Personal history of colonic polyps: Secondary | ICD-10-CM

## 2021-06-15 MED ORDER — PEG 3350-KCL-NA BICARB-NACL 420 G PO SOLR: 4000.0000 mL | Freq: Once | ORAL | 0 refills | Status: DC

## 2021-06-15 NOTE — Progress Notes (Signed)
Gastroenterology Pre-Procedure Review  Request Date: 06/15/2021 Requesting Physician: Valentino Nose, NP, Last TCS 09/24/2017 done by Dr. Oneida Alar, tubular adenoma, 5 year repeat recommended  PATIENT REVIEW QUESTIONS: The patient responded to the following health history questions as indicated:    1. Diabetes Melitis: yes, type II  2. Joint replacements in the past 12 months: no 3. Major health problems in the past 3 months: no 4. Has an artificial valve or MVP: no 5. Has a defibrillator: no 6. Has been advised in past to take antibiotics in advance of a procedure like teeth cleaning: no 7. Family history of colon cancer: no  8. Alcohol Use: no 9. Illicit drug Use: no 10. History of sleep apnea: no  11. History of coronary artery or other vascular stents placed within the last 12 months: no 12. History of any prior anesthesia complications: no 13. Body mass index is 33.53 kg/m.    MEDICATIONS & ALLERGIES:    Patient reports the following regarding taking any blood thinners:   Plavix? no Aspirin? no Coumadin? no Brilinta? no Xarelto? no Eliquis? no Pradaxa? no Savaysa? no Effient? no  Patient confirms/reports the following medications:  Current Outpatient Medications  Medication Sig Dispense Refill   ezetimibe-simvastatin (VYTORIN) 10-20 MG per tablet Take 1 tablet by mouth at bedtime.     sertraline (ZOLOFT) 100 MG tablet Take 1.5 tablets (150 mg total) by mouth daily. 45 tablet 0   sertraline (ZOLOFT) 100 MG tablet Take 1.5 tablets (150 mg total) by mouth daily. 135 tablet 3   tadalafil (CIALIS) 10 MG tablet Take 10 mg by mouth daily as needed.     tamsulosin (FLOMAX) 0.4 MG CAPS capsule Take 1 capsule by mouth daily.     telmisartan-hydrochlorothiazide (MICARDIS HCT) 80-12.5 MG tablet Take 1 tablet by mouth daily.     TOUJEO SOLOSTAR 300 UNIT/ML SOPN Inject 20 Units as directed at bedtime.     XIGDUO XR 04-999 MG TB24 Take 1 tablet by mouth daily.     No current  facility-administered medications for this visit.    Patient confirms/reports the following allergies:  No Known Allergies  No orders of the defined types were placed in this encounter.   AUTHORIZATION INFORMATION Primary Insurance: Medicare,  ID #: [id number] Pre-Cert / Auth required: No,not required  Secondary Insurance: [employer]/ [employer], ID#: [id number], Group#: [id number] Pre-Cert/ Auth required:   SCHEDULE INFORMATION: Procedure has been scheduled as follows:  Date: , Time:   Location:   This Gastroenterology Pre-Precedure Review Form is being routed to the following provider(s): Aliene Altes, PA-C

## 2021-06-15 NOTE — Progress Notes (Signed)
Pt came in for nurse triage visit.  He was referred by his PCP.  Pt is not due until 2024-2026 per last procedure note.  Discussed with pt and he decided to wait until recommended time frame in case insurance would not cover.

## 2021-06-16 NOTE — Progress Notes (Signed)
Noted. Please make sure he is on recall.

## 2021-06-17 NOTE — Progress Notes (Signed)
Pt is on recall

## 2021-06-23 ENCOUNTER — Other Ambulatory Visit: Payer: Self-pay | Admitting: Adult Health

## 2021-06-23 DIAGNOSIS — F429 Obsessive-compulsive disorder, unspecified: Secondary | ICD-10-CM

## 2021-06-23 DIAGNOSIS — F411 Generalized anxiety disorder: Secondary | ICD-10-CM

## 2022-01-14 ENCOUNTER — Ambulatory Visit
Admission: EM | Admit: 2022-01-14 | Discharge: 2022-01-14 | Disposition: A | Discharge status: Home / Self Care | Payer: POS

## 2022-01-14 DIAGNOSIS — L0291 Cutaneous abscess, unspecified: Secondary | ICD-10-CM

## 2022-01-14 NOTE — ED Triage Notes (Signed)
Pt reports he waswas diagnosed cutaneous abscess on my left hip on January 10, 2022.  States the are is getting, red, swelling, painful and bad smell. Pt taking Bactrim.

## 2022-01-18 NOTE — ED Provider Notes (Signed)
[facility]    CSN: [id number] Arrival date & time: 01/14/22  1520      History   Chief Complaint Chief Complaint  Patient presents with   Abscess    I was seen on June 27th for what was diagnosed as a cutaneous abscess on my left hip.I just want to see someone to make sure everything is going well. - Entered by patient    HPI Greg Oneill is a 66 y.o. male.   Presenting today with concern over an abscess that was diagnosed on 01/10/2022 while out at the beach on vacation.  He states this small bump appeared on his left hip that has now become a large red swollen painful abscess.  He was started on Bactrim during this appointment at the beach, states it was not drained at that time and the area has become larger, more red and painful.  Denies known fever, chills, sweats, abdominal pain, nausea vomiting or diarrhea.  Has been taking the medication compliantly.    Past Medical History:  Diagnosis Date   Anxiety    Arthritis    bil knees   Depression    Diabetes mellitus without complication [facility])     Patient Active Problem List   Diagnosis Date Noted   Morbid obesity due to excess calories ([place]) complicated by hbp,dm 57/32/2025   Upper airway cough syndrome 04/11/2019   DOE (dyspnea on exertion) 04/11/2019   Essential hypertension 04/11/2019   Special screening for malignant neoplasms, colon     Past Surgical History:  Procedure Laterality Date   COLONOSCOPY N/A 09/24/2017   Procedure: COLONOSCOPY;  Surgeon: Danie Binder, MD;  [facility];  Service: Endoscopy;  Laterality: N/A;  10:30   CYST EXCISION N/A 02/18/2019   Procedure: EXCISION CYST POSTERIOR NECK AND UPPER BACK;  Surgeon: Erroll Luna, MD;  Location: [place];  Service: General;  Laterality: N/A;   INSERTION OF MESH N/A 08/10/2014   Procedure: INSERTION OF MESH;  Surgeon: Jamesetta So, MD;  Location: [facility];  Service: General;  Laterality: N/A;   PILONIDAL  CYST EXCISION  1977   Dr Marnette Burgess   POLYPECTOMY  09/24/2017   Procedure: POLYPECTOMY;  Surgeon: Danie Binder, MD;  Location[facility];  Service: Endoscopy;;  colon   UMBILICAL HERNIA REPAIR N/A 08/10/2014   Procedure: UMBILICAL HERNIORRHAPHY ;  Surgeon: Jamesetta So, MD;  Location: [facility];  Service: General;  Laterality: N/A;       Home Medications    Prior to Admission medications   Medication Sig Start Date End Date Taking? Authorizing Provider  Sulfamethoxazole-Trimethoprim (BACTRIM PO) Take by mouth.   Yes [provider]  ezetimibe-simvastatin (VYTORIN) 10-20 MG per tablet Take 1 tablet by mouth at bedtime.    [provider]  Multiple Vitamin (MULTIVITAMIN ADULT PO) Take by mouth. Takes Mega Man multivitamin daily.    [provider]  sertraline (ZOLOFT) 100 MG tablet Take 1.5 tablets (150 mg total) by mouth daily. 05/25/21   Mozingo, Berdie Ogren, NP  sertraline (ZOLOFT) 100 MG tablet TAKE 1 AND 1/2 TABLETS(150 MG) BY MOUTH DAILY 06/23/21   Mozingo, Berdie Ogren, NP  tadalafil (CIALIS) 10 MG tablet Take 10 mg by mouth daily as needed. 02/03/21   [provider]  tamsulosin (FLOMAX) 0.4 MG CAPS capsule Take 1 capsule by mouth daily. 12/26/18   [provider]  telmisartan-hydrochlorothiazide (MICARDIS HCT) 80-12.5 MG tablet Take 1 tablet by mouth  daily. 05/16/21   [provider]  TOUJEO SOLOSTAR 300 UNIT/ML SOPN Inject 20 Units as directed at bedtime. 07/03/17   [provider]  XIGDUO XR 04-999 MG TB24 Take 1 tablet by mouth daily. 04/19/21   [provider]  gabapentin (NEURONTIN) 300 MG capsule One daily in am 03/25/20 09/29/20  Tanda Rockers, MD  metFORMIN (GLUCOPHAGE) 500 MG tablet Take 500 mg by mouth 2 (two) times daily.  08/05/14 09/29/20  [provider]  pantoprazole (PROTONIX) 40 MG tablet Take 1 tablet (40 mg total) by mouth daily. Take 30-60 min before first meal of the day 01/07/20  09/29/20  Tanda Rockers, MD    Family History Family History  Problem Relation Age of Onset   Bladder Cancer Father    Parkinson's disease Brother    Colon cancer Neg Hx    Colon polyps Neg Hx     Social History Social History   Tobacco Use   Smoking status: Former    Packs/day: 1.00    Years: 14.00    Total pack years: 14.00    Types: Cigarettes    Quit date: 07/17/1978    Years since quitting: 43.5   Smokeless tobacco: Former  Scientific laboratory technician Use: Never used  Substance Use Topics   Alcohol use: No   Drug use: No     Allergies   Patient has no known allergies.   Review of Systems Review of Systems Per HPI  Physical Exam Triage Vital Signs ED Triage Vitals  Enc Vitals Group     BP 01/14/22 1605 (!) 159/74     Pulse Rate 01/14/22 1605 81     Resp 01/14/22 1605 20     Temp 01/14/22 1605 98.1 F (36.7 C)     Temp Source 01/14/22 1605 Oral     SpO2 01/14/22 1605 97 %     Weight --      Height --      Head Circumference --      Peak Flow --      Pain Score 01/14/22 1604 6     Pain Loc --      Pain Edu? --      Excl. in [place]? --    No data found.  Updated Vital Signs BP (!) 159/74 (BP Location: Right Arm)   Pulse 81   Temp 98.1 F (36.7 C) (Oral)   Resp 20   SpO2 97%   Visual Acuity Right Eye Distance:   Left Eye Distance:   Bilateral Distance:    Right Eye Near:   Left Eye Near:    Bilateral Near:     Physical Exam Vitals and nursing note reviewed.  Constitutional:      Appearance: Normal appearance.  HENT:     Head: Atraumatic.  Eyes:     Extraocular Movements: Extraocular movements intact.     Conjunctiva/sclera: Conjunctivae normal.  Cardiovascular:     Rate and Rhythm: Normal rate and regular rhythm.  Pulmonary:     Effort: Pulmonary effort is normal.     Breath sounds: Normal breath sounds.  Musculoskeletal:        General: Normal range of motion.     Cervical back: Normal range of motion and neck supple.  Skin:     General: Skin is warm and dry.     Findings: Erythema present.     Comments: 3 to 4 cm erythematous, edematous abscess with mild fluctuance to the left  lateral hip.  Significantly tender to palpation.  No active drainage  Neurological:     General: No focal deficit present.     Mental Status: He is oriented to person, place, and time.     Comments: Left lower extremity neurovascularly intact  Psychiatric:        Mood and Affect: Mood normal.        Thought Content: Thought content normal.        Judgment: Judgment normal.    UC Treatments / Results  Labs (all labs ordered are listed, but only abnormal results are displayed) Labs Reviewed - No data to display  EKG  Radiology No results found.  Procedures Incision and Drainage  Date/Time: 01/14/2022 4:30 PM  Performed by: Volney American, PA-C Authorized by: Volney American, PA-C   Consent:    Consent obtained:  Verbal   Consent given by:  Patient   Risks, benefits, and alternatives were discussed: yes     Risks discussed:  Bleeding, incomplete drainage, infection and pain   Alternatives discussed:  Alternative treatment Universal protocol:    Procedure explained and questions answered to patient or proxy's satisfaction: yes     Relevant documents present and verified: yes     Patient identity confirmed:  Verbally with patient Location:    Type:  Abscess   Size:  4 cm   Location:  Lower extremity   Lower extremity location:  Leg   Leg location:  L upper leg Pre-procedure details:    Skin preparation:  Chlorhexidine with alcohol Sedation:    Sedation type:  None Anesthesia:    Anesthesia method:  Local infiltration   Local anesthetic:  Lidocaine 2% w/o epi Procedure type:    Complexity:  Simple Procedure details:    Incision types:  Stab incision   Incision depth:  Dermal   Wound management:  Probed and deloculated   Drainage:  Purulent and bloody   Drainage amount:  Moderate   Wound treatment:   Wound left open   Packing materials:  None Post-procedure details:    Procedure completion:  Tolerated well, no immediate complications  (including critical care time)  Medications Ordered in UC Medications - No data to display  Initial Impression / Assessment and Plan / UC Course  I have reviewed the triage vital signs and the nursing notes.  Pertinent labs & imaging results that were available during my care of the patient were reviewed by me and considered in my medical decision making (see chart for details).     I&D performed without complication, continue the Bactrim given at previous visit, warm compresses, close PCP follow-up.  Return for worsening symptoms at any time.  Final Clinical Impressions(s) / UC Diagnoses   Final diagnoses:  Abscess   Discharge Instructions   None    ED Prescriptions   None    PDMP not reviewed this encounter.   Volney American, [place] 01/18/22 2002

## 2022-05-24 ENCOUNTER — Ambulatory Visit (INDEPENDENT_AMBULATORY_CARE_PROVIDER_SITE_OTHER): Payer: POS | Admitting: Adult Health

## 2022-05-24 ENCOUNTER — Encounter: Payer: Self-pay | Admitting: Adult Health

## 2022-05-24 DIAGNOSIS — F331 Major depressive disorder, recurrent, moderate: Secondary | ICD-10-CM | POA: Diagnosis not present

## 2022-05-24 DIAGNOSIS — F429 Obsessive-compulsive disorder, unspecified: Secondary | ICD-10-CM

## 2022-05-24 DIAGNOSIS — F411 Generalized anxiety disorder: Secondary | ICD-10-CM

## 2022-05-24 MED ORDER — SERTRALINE HCL 100 MG PO TABS: 150.0000 mg | ORAL_TABLET | Freq: Every day | ORAL | 3 refills | Status: DC

## 2022-05-24 NOTE — Progress Notes (Signed)
Greg Oneill [id number] [birth year] 66 y.o.  Subjective:   Patient ID:  Greg Oneill is a 66 y.o. (DOB 11/23/1955) male.  Chief Complaint: No chief complaint on file.   HPI Greg Oneill presents to the office today for follow-up of GAD, MDD, and OCD.  Describes mood today as "ok". Pleasant. Mood symptoms - denies depression and irritability. Reports decreased anxiety - "it comes and goes - nothing like it used to be". Decreased worry and rumination. Mood is consistent. Stating "I'm doing pretty good - happier". Feels like Zoloft continues to work well for him - taking '150mg'$  daily. Stable interest and motivation. Taking medications as prescribed.  Energy levels stable. Active, has a regular exercise routine. Swimming 6 days a week. Walking. Golfing 6 to 7 days a week.  Enjoys some usual interests and activities. Married. Lives with wife. Has 3 grown sons. 1 granddaughter. Spending time with family. Appetite adequate - eating less. Weight loss - 232 - type 2 diabetic - A1C down.  Sleeps well most nights. Averages 7 to 8 hours - broken sleep - "feels rested". Napping in the afternoon. Focus and concentration stable. Completing tasks. Managing aspects of household. Retired from post office in 2018-32 years. Denies SI or HI.  Denies AH or VH.  Previous medication trials: Lexapro, Wellbutrin, Prozac, Paxil   Flowsheet Row ED from 01/14/2022 in [facility] ED from 09/29/2020 in [facility] [place] No Risk No Risk        Review of Systems:  Review of Systems  Musculoskeletal:  Negative for gait problem.  Neurological:  Negative for tremors.  Psychiatric/Behavioral:         Please refer to HPI    Medications: I have reviewed the patient's current medications.  Current Outpatient Medications  Medication Sig Dispense Refill   ezetimibe-simvastatin (VYTORIN) 10-20 MG per tablet Take 1 tablet by mouth at  bedtime.     Multiple Vitamin (MULTIVITAMIN ADULT PO) Take by mouth. Takes Mega Man multivitamin daily.     sertraline (ZOLOFT) 100 MG tablet Take 1.5 tablets (150 mg total) by mouth daily. 135 tablet 3   sertraline (ZOLOFT) 100 MG tablet TAKE 1 AND 1/2 TABLETS(150 MG) BY MOUTH DAILY 45 tablet 10   Sulfamethoxazole-Trimethoprim (BACTRIM PO) Take by mouth.     tadalafil (CIALIS) 10 MG tablet Take 10 mg by mouth daily as needed.     tamsulosin (FLOMAX) 0.4 MG CAPS capsule Take 1 capsule by mouth daily.     telmisartan-hydrochlorothiazide (MICARDIS HCT) 80-12.5 MG tablet Take 1 tablet by mouth daily.     TOUJEO SOLOSTAR 300 UNIT/ML SOPN Inject 20 Units as directed at bedtime.     XIGDUO XR 04-999 MG TB24 Take 1 tablet by mouth daily.     No current facility-administered medications for this visit.    Medication Side Effects: None  Allergies: No Known Allergies  Past Medical History:  Diagnosis Date   Anxiety    Arthritis    bil knees   Depression    Diabetes mellitus without complication ([place])     Past Medical History, Surgical history, Social history, and Family history were reviewed and updated as appropriate.   Please see review of systems for further details on the patient's review from today.   Objective:   Physical Exam:  There were no vitals taken for this visit.  Physical Exam Constitutional:      General: He is not  in acute distress. Musculoskeletal:        General: No deformity.  Neurological:     Mental Status: He is alert and oriented to person, place, and time.     Coordination: Coordination normal.  Psychiatric:        Attention and Perception: Attention and perception normal. He does not perceive auditory or visual hallucinations.        Mood and Affect: Mood normal. Mood is not anxious or depressed. Affect is not labile, blunt, angry or inappropriate.        Speech: Speech normal.        Behavior: Behavior normal.        Thought Content: Thought content  normal. Thought content is not paranoid or delusional. Thought content does not include homicidal or suicidal ideation. Thought content does not include homicidal or suicidal plan.        Cognition and Memory: Cognition and memory normal.        Judgment: Judgment normal.     Comments: Insight intact     Lab Review:     Component Value Date/Time   NA 134 (L) 02/14/2019 1013   K 4.4 02/14/2019 1013   CL 100 02/14/2019 1013   CO2 24 02/14/2019 1013   GLUCOSE 151 (H) 02/14/2019 1013   BUN 12 02/14/2019 1013   CREATININE 1.07 02/14/2019 1013   CALCIUM 8.8 (L) 02/14/2019 1013   PROT 6.5 02/14/2019 1013   ALBUMIN 4.0 02/14/2019 1013   AST 37 02/14/2019 1013   ALT 29 02/14/2019 1013   ALKPHOS 67 02/14/2019 1013   BILITOT 1.8 (H) 02/14/2019 1013   GFRNONAA >60 02/14/2019 1013   GFRAA >60 02/14/2019 1013       Component Value Date/Time   WBC 6.7 04/11/2019 1507   RBC 4.76 04/11/2019 1507   HGB 15.3 04/11/2019 1507   HCT 44.1 04/11/2019 1507   PLT 203.0 04/11/2019 1507   MCV 92.7 04/11/2019 1507   MCH 31.1 02/14/2019 1013   MCHC 34.6 04/11/2019 1507   RDW 14.1 04/11/2019 1507   LYMPHSABS 1.5 04/11/2019 1507   MONOABS 0.5 04/11/2019 1507   EOSABS 0.1 04/11/2019 1507   BASOSABS 0.0 04/11/2019 1507    No results found for: "POCLITH", "LITHIUM"   No results found for: "PHENYTOIN", "PHENOBARB", "VALPROATE", "CBMZ"   .res Assessment: Plan:    Plan:  PDMP reviewed  1.  Zoloft '150mg'$  daily  RTC 1 year  Patient advised to contact office with any questions, adverse effects, or acute worsening in signs and symptoms.  There are no diagnoses linked to this encounter.   Please see After Visit Summary for patient specific instructions.  No future appointments.  No orders of the defined types were placed in this encounter.   -------------------------------

## 2022-08-22 ENCOUNTER — Encounter: Payer: Self-pay | Admitting: Gastroenterology

## 2022-08-29 ENCOUNTER — Encounter: Payer: Self-pay | Admitting: *Deleted

## 2022-09-19 ENCOUNTER — Telehealth (INDEPENDENT_AMBULATORY_CARE_PROVIDER_SITE_OTHER): Payer: Self-pay | Admitting: *Deleted

## 2022-09-19 NOTE — Telephone Encounter (Signed)
  Procedure: Colonoscopy  Height: 6'0 Weight: 220lbs        Have you had a colonoscopy before?  09/24/17, Dr. Oneida Alar  Do you have family history of colon cancer?  no  Do you have a family history of polyps? no  Previous colonoscopy with polyps removed?   Do you have a history colorectal cancer?   no  Are you diabetic?  Yes, type 2  Do you have a prosthetic or mechanical heart valve? no  Do you have a pacemaker/defibrillator?   no  Have you had endocarditis/atrial fibrillation?  no  Do you use supplemental oxygen/CPAP?  no  Have you had joint replacement within the last 12 months?  no  Do you tend to be constipated or have to use laxatives?  no   Do you have history of alcohol use? If yes, how much and how often.  no  Do you have history or are you using drugs? If yes, what do are you  using?  no  Have you ever had a stroke/heart attack?  no  Have you ever had a heart or other vascular stent placed,?no  Do you take weight loss medication? No maybe yes, I take mounjaro once a week  Do you take any blood-thinning medications such as: (Plavix, aspirin, Coumadin, Aggrenox, Brilinta, Xarelto, Eliquis, Pradaxa, Savaysa or Effient)? no  If yes we need the name, milligram, dosage and who is prescribing doctor:               Current Outpatient Medications  Medication Sig Dispense Refill   alfuzosin (UROXATRAL) 10 MG 24 hr tablet Take 10 mg by mouth daily with breakfast.     ezetimibe-simvastatin (VYTORIN) 10-20 MG per tablet Take 1 tablet by mouth at bedtime.     sertraline (ZOLOFT) 100 MG tablet TAKE 1 AND 1/2 TABLETS(150 MG) BY MOUTH DAILY 45 tablet 10   telmisartan-hydrochlorothiazide (MICARDIS HCT) 80-12.5 MG tablet Take 1 tablet by mouth daily.     tirzepatide (MOUNJARO) 15 MG/0.5ML Pen Inject 15 mg into the skin once a week.     TOUJEO SOLOSTAR 300 UNIT/ML SOPN Inject 20 Units as directed at bedtime.     XIGDUO XR 04-999 MG TB24 Take 1 tablet by mouth daily.     No  current facility-administered medications for this visit.    No Known Allergies

## 2022-10-02 MED ORDER — PEG 3350-KCL-NA BICARB-NACL 420 G PO SOLR
4000.0000 mL | Freq: Once | ORAL | 0 refills | Status: AC
Start: 2022-10-02 — End: 2022-10-02

## 2022-10-02 NOTE — Telephone Encounter (Signed)
Questionnaire from recall, no referral needed  

## 2022-10-02 NOTE — Addendum Note (Signed)
Addended by: Cheron Every on: 10/02/2022 09:23 AM   Modules accepted: Orders

## 2022-10-02 NOTE — Telephone Encounter (Signed)
CALLED PT. He has been scheduled for 4/12 at 11:30am, Aware will send instructions via mychart. He reports he lab work done recently and is going to see about getting me a copy to see if it has what he needs. Also aware will send rx prep to pharmacy. Aware regarding meds to hold also

## 2022-10-02 NOTE — Telephone Encounter (Signed)
Ok to schedule. ASA 2.   H/o tubular adenoma removed in 2019. Due for surveillance. Hold Mounjaro 7 days before TCS. Hold Xigduo XR 72 hours before TCS. Day of prep: Toujeo 10 units at bedtime. AM of TCS: no Toujeo.  Will need BMET for HCTZ use.

## 2022-10-16 ENCOUNTER — Other Ambulatory Visit (HOSPITAL_COMMUNITY): Payer: Self-pay

## 2022-10-27 ENCOUNTER — Ambulatory Visit (HOSPITAL_BASED_OUTPATIENT_CLINIC_OR_DEPARTMENT_OTHER): Payer: 59 | Admitting: Certified Registered Nurse Anesthetist

## 2022-10-27 ENCOUNTER — Encounter (HOSPITAL_COMMUNITY): Payer: Self-pay

## 2022-10-27 ENCOUNTER — Encounter (HOSPITAL_COMMUNITY)
Admission: RE | Disposition: A | Discharge status: Home / Self Care | Payer: Self-pay | Source: Home / Self Care | Attending: Internal Medicine

## 2022-10-27 ENCOUNTER — Ambulatory Visit (HOSPITAL_COMMUNITY): Payer: 59 | Admitting: Certified Registered Nurse Anesthetist

## 2022-10-27 ENCOUNTER — Other Ambulatory Visit: Payer: Self-pay

## 2022-10-27 ENCOUNTER — Ambulatory Visit (HOSPITAL_COMMUNITY)
Admission: RE | Admit: 2022-10-27 | Discharge: 2022-10-27 | Disposition: A | Discharge status: Home / Self Care | Payer: 59 | Attending: Internal Medicine | Admitting: Internal Medicine

## 2022-10-27 DIAGNOSIS — K648 Other hemorrhoids: Secondary | ICD-10-CM | POA: Diagnosis not present

## 2022-10-27 DIAGNOSIS — Z1211 Encounter for screening for malignant neoplasm of colon: Secondary | ICD-10-CM | POA: Insufficient documentation

## 2022-10-27 DIAGNOSIS — F419 Anxiety disorder, unspecified: Secondary | ICD-10-CM | POA: Insufficient documentation

## 2022-10-27 DIAGNOSIS — I10 Essential (primary) hypertension: Secondary | ICD-10-CM | POA: Insufficient documentation

## 2022-10-27 DIAGNOSIS — E119 Type 2 diabetes mellitus without complications: Secondary | ICD-10-CM | POA: Diagnosis not present

## 2022-10-27 DIAGNOSIS — K6389 Other specified diseases of intestine: Secondary | ICD-10-CM

## 2022-10-27 DIAGNOSIS — F32A Depression, unspecified: Secondary | ICD-10-CM | POA: Insufficient documentation

## 2022-10-27 DIAGNOSIS — Z86010 Personal history of colonic polyps: Secondary | ICD-10-CM

## 2022-10-27 DIAGNOSIS — Z7985 Long-term (current) use of injectable non-insulin antidiabetic drugs: Secondary | ICD-10-CM | POA: Diagnosis not present

## 2022-10-27 DIAGNOSIS — Z7984 Long term (current) use of oral hypoglycemic drugs: Secondary | ICD-10-CM | POA: Diagnosis not present

## 2022-10-27 DIAGNOSIS — K635 Polyp of colon: Secondary | ICD-10-CM | POA: Diagnosis not present

## 2022-10-27 DIAGNOSIS — Z794 Long term (current) use of insulin: Secondary | ICD-10-CM | POA: Diagnosis not present

## 2022-10-27 DIAGNOSIS — Z87891 Personal history of nicotine dependence: Secondary | ICD-10-CM | POA: Insufficient documentation

## 2022-10-27 DIAGNOSIS — D124 Benign neoplasm of descending colon: Secondary | ICD-10-CM

## 2022-10-27 DIAGNOSIS — Z09 Encounter for follow-up examination after completed treatment for conditions other than malignant neoplasm: Secondary | ICD-10-CM | POA: Diagnosis not present

## 2022-10-27 HISTORY — DX: Pure hypercholesterolemia, unspecified: E78.00

## 2022-10-27 HISTORY — PX: COLONOSCOPY WITH PROPOFOL: SHX5780

## 2022-10-27 HISTORY — DX: Essential (primary) hypertension: I10

## 2022-10-27 HISTORY — PX: POLYPECTOMY: SHX5525

## 2022-10-27 HISTORY — PX: BIOPSY: SHX5522

## 2022-10-27 LAB — GLUCOSE, CAPILLARY: Glucose-Capillary: 116 mg/dL — ABNORMAL HIGH (ref 70–99)

## 2022-10-27 SURGERY — COLONOSCOPY WITH PROPOFOL
Anesthesia: General

## 2022-10-27 MED ORDER — PROPOFOL 10 MG/ML IV BOLUS
INTRAVENOUS | Status: DC | PRN
  Administered 2022-10-27: 100 mg via INTRAVENOUS

## 2022-10-27 MED ORDER — LIDOCAINE HCL (CARDIAC) PF 100 MG/5ML IV SOSY
PREFILLED_SYRINGE | INTRAVENOUS | Status: DC | PRN
  Administered 2022-10-27: 50 mg via INTRAVENOUS

## 2022-10-27 MED ORDER — PROPOFOL 500 MG/50ML IV EMUL
INTRAVENOUS | Status: DC | PRN
  Administered 2022-10-27: 150 ug/kg/min via INTRAVENOUS

## 2022-10-27 MED ORDER — LACTATED RINGERS IV SOLN: INTRAVENOUS | Status: DC | PRN

## 2022-10-27 MED ORDER — LACTATED RINGERS IV SOLN: INTRAVENOUS | Status: DC

## 2022-10-27 NOTE — Op Note (Signed)
[facility] Patient Name: Greg Oneill Procedure Date: 10/27/2022 10:30 AM MRN: [id number] Date of Birth: 1956/04/12 Attending MD: Hennie Duos. Marletta Lor , [place], [id number] CSN: [id number] Age: 67 Admit Type: Outpatient Procedure:                Colonoscopy Indications:              Surveillance: Personal history of adenomatous                            polyps on last colonoscopy 5 years ago Providers:                Hennie Duos. Marletta Lor, DO, Jannett Celestine, RN, Pandora Leiter, Technician Referring MD:              Medicines:                See the Anesthesia note for documentation of the                            administered medications Complications:            No immediate complications. Estimated Blood Loss:     Estimated blood loss was minimal. Procedure:                Pre-Anesthesia Assessment:                           - The anesthesia plan was to use monitored                            anesthesia care (MAC).                           After obtaining informed consent, the colonoscope                            was passed under direct vision. Throughout the                            procedure, the patient's blood pressure, pulse, and                            oxygen saturations were monitored continuously. The                            PCF-HQ190L ([id number]) scope was introduced through                            the anus and advanced to the the cecum, identified                            by appendiceal orifice and ileocecal valve. The                            colonoscopy was performed without  difficulty. The                            patient tolerated the procedure well. The quality                            of the bowel preparation was evaluated using the                            BBPS [facility] Bowel Preparation Scale) with scores                            of: Right Colon = 2 (minor amount of residual                            staining,  small fragments of stool and/or opaque                            liquid, but mucosa seen well), Transverse Colon = 3                            (entire mucosa seen well with no residual staining,                            small fragments of stool or opaque liquid) and Left                            Colon = 3 (entire mucosa seen well with no residual                            staining, small fragments of stool or opaque                            liquid). The total BBPS score equals 8. The quality                            of the bowel preparation was good. Scope In: 10:40:57 AM Scope Out: 10:55:58 AM Scope Withdrawal Time: 0 hours 11 minutes 8 seconds  Total Procedure Duration: 0 hours 15 minutes 1 second  Findings:      A 4 mm polyp was found in the descending colon. The polyp was sessile.       The polyp was removed with a cold snare. Resection and retrieval were       complete.      A localized area of mildly nodular mucosa was found in the cecum.       Biopsies were taken with a cold forceps for histology. Impression:               - One 4 mm polyp in the descending colon, removed                            with a cold snare. Resected and retrieved.                           -  Nodular mucosa in the cecum. Biopsied. Moderate Sedation:      Per Anesthesia Care Recommendation:           - Patient has a contact number available for                            emergencies. The signs and symptoms of potential                            delayed complications were discussed with the                            patient. Return to normal activities tomorrow.                            Written discharge instructions were provided to the                            patient.                           - Resume previous diet.                           - Continue present medications.                           - Await pathology results.                           - Repeat colonoscopy in 7-10  years for surveillance.                           - Return to GI clinic PRN. Procedure Code(s):        --- Professional ---                           [phone], Colonoscopy, flexible; with removal of                            tumor(s), polyp(s), or other lesion(s) by snare                            technique                           45380, 59, Colonoscopy, flexible; with biopsy,                            single or multiple Diagnosis Code(s):        --- Professional ---                           Z86.010, Personal history of colonic polyps                           D12.4, Benign neoplasm of descending colon  K63.89, Other specified diseases of intestine                           K64.8, Other hemorrhoids CPT copyright 2022 American Medical Association. All rights reserved. The codes documented in this report are preliminary and upon coder review may  be revised to meet current compliance requirements. Hennie Duos. Marletta Lor, DO Hennie Duos. Marletta Lor, DO 10/27/2022 11:01:41 AM This report has been signed electronically. Number of Addenda: 0

## 2022-10-27 NOTE — Anesthesia Preprocedure Evaluation (Signed)
Anesthesia Evaluation  Patient identified by MRN, date of birth, ID band Patient awake    Reviewed: Allergy & Precautions, H&P , NPO status , Patient's Chart, lab work & pertinent test results, reviewed documented beta blocker date and time   Airway Mallampati: II  TM Distance: >3 FB Neck ROM: full    Dental no notable dental hx.    Pulmonary neg pulmonary ROS, former smoker   Pulmonary exam normal breath sounds clear to auscultation       Cardiovascular Exercise Tolerance: Good hypertension, negative cardio ROS  Rhythm:regular Rate:Normal     Neuro/Psych  PSYCHIATRIC DISORDERS Anxiety Depression    negative neurological ROS     GI/Hepatic negative GI ROS, Neg liver ROS,,,  Endo/Other  negative endocrine ROSdiabetes    Renal/GU negative Renal ROS  negative genitourinary   Musculoskeletal negative musculoskeletal ROS (+) Arthritis ,    Abdominal   Peds negative pediatric ROS (+)  Hematology negative hematology ROS (+)   Anesthesia Other Findings   Reproductive/Obstetrics negative OB ROS                             Anesthesia Physical Anesthesia Plan  ASA: 2  Anesthesia Plan: General   Post-op Pain Management:    Induction:   PONV Risk Score and Plan: Propofol infusion  Airway Management Planned:   Additional Equipment:   Intra-op Plan:   Post-operative Plan:   Informed Consent: I have reviewed the patients History and Physical, chart, labs and discussed the procedure including the risks, benefits and alternatives for the proposed anesthesia with the patient or authorized representative who has indicated his/her understanding and acceptance.     Dental Advisory Given  Plan Discussed with: CRNA  Anesthesia Plan Comments:        Anesthesia Quick Evaluation

## 2022-10-27 NOTE — H&P (Signed)
Primary Care Physician:  Benita Stabile, MD Primary Gastroenterologist:  Dr. Marletta Lor  Pre-Procedure History & Physical: HPI:  Greg Oneill is a 67 y.o. male is here for a colonoscopy to be performed for surveillance purposes, personal history of adenomatous colon polyps in 2019  Past Medical History:  Diagnosis Date   Anxiety    Arthritis    bil knees   Depression    Diabetes mellitus without complication    Hypercholesteremia    Hypertension     Past Surgical History:  Procedure Laterality Date   COLONOSCOPY N/A 09/24/2017   Procedure: COLONOSCOPY;  Surgeon: West Bali, MD;  Location: [facility];  Service: Endoscopy;  Laterality: N/A;  10:30   CYST EXCISION N/A 02/18/2019   Procedure: EXCISION CYST POSTERIOR NECK AND UPPER BACK;  Surgeon: Harriette Bouillon, MD;  Location: [facility];  Service: General;  Laterality: N/A;   INSERTION OF MESH N/A 08/10/2014   Procedure: INSERTION OF MESH;  Surgeon: Dalia Heading, MD;  Location: [facility];  Service: General;  Laterality: N/A;   PILONIDAL CYST EXCISION  1977   Dr Leona Carry   POLYPECTOMY  09/24/2017   Procedure: POLYPECTOMY;  Surgeon: West Bali, MD;  Location: [facility];  Service: Endoscopy;;  colon   UMBILICAL HERNIA REPAIR N/A 08/10/2014   Procedure: UMBILICAL HERNIORRHAPHY ;  Surgeon: Dalia Heading, MD;  Location: [facility];  Service: General;  Laterality: N/A;    Prior to Admission medications   Medication Sig Start Date End Date Taking? Authorizing Provider  alfuzosin (UROXATRAL) 10 MG 24 hr tablet Take 10 mg by mouth daily with breakfast.   Yes [provider]  ezetimibe-simvastatin (VYTORIN) 10-20 MG per tablet Take 1 tablet by mouth at bedtime.   Yes [provider]  Multiple Vitamins-Minerals (MEGA MULTI MEN) TABS Take 1 capsule by mouth daily. 50+   Yes [provider]  sertraline (ZOLOFT) 100 MG tablet TAKE 1 AND 1/2 TABLETS(150 MG) BY MOUTH DAILY 06/23/21  Yes Mozingo,  Thereasa Solo, NP  telmisartan-hydrochlorothiazide (MICARDIS HCT) 80-12.5 MG tablet Take 1 tablet by mouth daily. 05/16/21  Yes [provider]  tirzepatide Greggory Keen) 15 MG/0.5ML Pen Inject 15 mg into the skin once a week.   Yes [provider]  TOUJEO SOLOSTAR 300 UNIT/ML SOPN Inject 20 Units as directed at bedtime. 07/03/17  Yes [provider]  XIGDUO XR 04-999 MG TB24 Take 1 tablet by mouth daily. 04/19/21  Yes [provider]  gabapentin (NEURONTIN) 300 MG capsule One daily in am 03/25/20 09/29/20  Nyoka Cowden, MD  metFORMIN (GLUCOPHAGE) 500 MG tablet Take 500 mg by mouth 2 (two) times daily.  08/05/14 09/29/20  [provider]  pantoprazole (PROTONIX) 40 MG tablet Take 1 tablet (40 mg total) by mouth daily. Take 30-60 min before first meal of the day 01/07/20 09/29/20  Nyoka Cowden, MD    Allergies as of 10/02/2022   (No Known Allergies)    Family History  Problem Relation Age of Onset   Bladder Cancer Father    Parkinson's disease Brother    Colon cancer Neg Hx    Colon polyps Neg Hx     Social History   Socioeconomic History   Marital status: Married    Spouse name: Not on file   Number of children: Not on file   Years of education: Not on file   Highest education level: Not on file  Occupational History  Not on file  Tobacco Use   Smoking status: Former    Packs/day: 1.00    Years: 14.00    Additional pack years: 0.00    Total pack years: 14.00    Types: Cigarettes    Quit date: 07/17/1978    Years since quitting: 44.3   Smokeless tobacco: Former  Building services engineer Use: Never used  Substance and Sexual Activity   Alcohol use: No   Drug use: No   Sexual activity: Yes  Other Topics Concern   Not on file  Social History Narrative   Not on file   Social Determinants of Health   Financial Resource Strain: Not on file  Food Insecurity: Not on file  Transportation Needs: Not on file  Physical Activity: Not on  file  Stress: Not on file  Social Connections: Not on file  Intimate Partner Violence: Not on file    Review of Systems: See HPI, otherwise negative ROS  Physical Exam: Vital signs in last 24 hours: Temp:  [97.6 F (36.4 C)] 97.6 F (36.4 C) (04/12 1012) Pulse Rate:  [56] 56 (04/12 1012) Resp:  [14] 14 (04/12 1012) BP: (124)/(69) 124/69 (04/12 1012) SpO2:  [98 %] 98 % (04/12 1012) Weight:  [99.8 kg] 99.8 kg (04/12 1012)   General:   Alert,  Well-developed, well-nourished, pleasant and cooperative in NAD Head:  Normocephalic and atraumatic. Eyes:  Sclera clear, no icterus.   Conjunctiva pink. Ears:  Normal auditory acuity. Nose:  No deformity, discharge,  or lesions. Msk:  Symmetrical without gross deformities. Normal posture. Extremities:  Without clubbing or edema. Neurologic:  Alert and  oriented x4;  grossly normal neurologically. Skin:  Intact without significant lesions or rashes. Psych:  Alert and cooperative. Normal mood and affect.  Impression/Plan: Greg Oneill is here for a colonoscopy to be performed for surveillance purposes, personal history of adenomatous colon polyps in 2019  The risks of the procedure including infection, bleed, or perforation as well as benefits, limitations, alternatives and imponderables have been reviewed with the patient. Questions have been answered. All parties agreeable.

## 2022-10-27 NOTE — Discharge Instructions (Addendum)
  Colonoscopy Discharge Instructions  Read the instructions outlined below and refer to this sheet in the next few weeks. These discharge instructions provide you with general information on caring for yourself after you leave the hospital. Your doctor may also give you specific instructions. While your treatment has been planned according to the most current medical practices available, unavoidable complications occasionally occur.   ACTIVITY You may resume your regular activity, but move at a slower pace for the next 24 hours.  Take frequent rest periods for the next 24 hours.  Walking will help get rid of the air and reduce the bloated feeling in your belly (abdomen).  No driving for 24 hours (because of the medicine (anesthesia) used during the test).   Do not sign any important legal documents or operate any machinery for 24 hours (because of the anesthesia used during the test).  NUTRITION Drink plenty of fluids.  You may resume your normal diet as instructed by your doctor.  Begin with a light meal and progress to your normal diet. Heavy or fried foods are harder to digest and may make you feel sick to your stomach (nauseated).  Avoid alcoholic beverages for 24 hours or as instructed.  MEDICATIONS You may resume your normal medications unless your doctor tells you otherwise.  WHAT YOU CAN EXPECT TODAY Some feelings of bloating in the abdomen.  Passage of more gas than usual.  Spotting of blood in your stool or on the toilet paper.  IF YOU HAD POLYPS REMOVED DURING THE COLONOSCOPY: No aspirin products for 7 days or as instructed.  No alcohol for 7 days or as instructed.  Eat a soft diet for the next 24 hours.  FINDING OUT THE RESULTS OF YOUR TEST Not all test results are available during your visit. If your test results are not back during the visit, make an appointment with your caregiver to find out the results. Do not assume everything is normal if you have not heard from your  caregiver or the medical facility. It is important for you to follow up on all of your test results.  SEEK IMMEDIATE MEDICAL ATTENTION IF: You have more than a spotting of blood in your stool.  Your belly is swollen (abdominal distention).  You are nauseated or vomiting.  You have a temperature over 101.  You have abdominal pain or discomfort that is severe or gets worse throughout the day.   Your colonoscopy revealed 1 polyp(s) which I removed successfully.   There was an area of nodular mucosa on the right side of your colon which I took samples of. This is most likely benign, nothing to worry about.  Await pathology results, my office will contact you. I recommend repeating colonoscopy in 7-10 years for surveillance purposes depending on pathology results. Otherwise follow up with GI as needed.     I hope you have a great rest of your week!  Hennie Duos. Marletta Lor, D.O. Gastroenterology and Hepatology [facility]

## 2022-10-27 NOTE — Anesthesia Postprocedure Evaluation (Signed)
Anesthesia Post Note  Patient: Greg Oneill  Procedure(s) Performed: COLONOSCOPY WITH PROPOFOL BIOPSY POLYPECTOMY  Patient location during evaluation: Phase II Anesthesia Type: General Level of consciousness: awake Pain management: pain level controlled Vital Signs Assessment: post-procedure vital signs reviewed and stable Respiratory status: spontaneous breathing and respiratory function stable Cardiovascular status: blood pressure returned to baseline and stable Postop Assessment: no headache and no apparent nausea or vomiting Anesthetic complications: no Comments: Late entry   No notable events documented.   Last Vitals:  Vitals:   10/27/22 1012 10/27/22 1058  BP: 124/69 (!) 92/43  Pulse: (!) 56 63  Resp: 14 15  Temp: 36.4 C 36.5 C  SpO2: 98% 94%    Last Pain:  Vitals:   10/27/22 1058  TempSrc: Axillary  PainSc: 0-No pain                 Windell Norfolk

## 2022-10-27 NOTE — Transfer of Care (Signed)
Immediate Anesthesia Transfer of Care Note  Patient: Greg Oneill  Procedure(s) Performed: COLONOSCOPY WITH PROPOFOL BIOPSY POLYPECTOMY  Patient Location: Endoscopy Unit  Anesthesia Type:General  Level of Consciousness: awake  Airway & Oxygen Therapy: Patient Spontanous Breathing  Post-op Assessment: Report given to RN and Post -op Vital signs reviewed and stable  Post vital signs: Reviewed and stable  Last Vitals:  Vitals Value Taken Time  BP 92/43   Temp 36.5   Pulse 63   Resp 15   SpO2 94%     Last Pain:  Vitals:   10/27/22 1058  TempSrc: Axillary  PainSc: 0-No pain      Patients Stated Pain Goal: 8 (10/27/22 1012)  Complications: No notable events documented.

## 2022-10-30 LAB — SURGICAL PATHOLOGY

## 2022-11-02 ENCOUNTER — Encounter (HOSPITAL_COMMUNITY): Payer: Self-pay | Admitting: Internal Medicine

## 2023-04-16 ENCOUNTER — Encounter: Payer: Self-pay | Admitting: Family Medicine

## 2023-04-16 ENCOUNTER — Telehealth: Payer: 59 | Admitting: Family Medicine

## 2023-04-16 DIAGNOSIS — H7393 Unspecified disorder of tympanic membrane, bilateral: Secondary | ICD-10-CM

## 2023-04-16 MED ORDER — AMOXICILLIN-POT CLAVULANATE 875-125 MG PO TABS
1.0000 | ORAL_TABLET | Freq: Two times a day (BID) | ORAL | 0 refills | Status: AC
Start: 2023-04-16 — End: 2023-04-26

## 2023-04-16 MED ORDER — PREDNISONE 10 MG (21) PO TBPK
ORAL_TABLET | ORAL | 0 refills | Status: DC
Start: 2023-04-16 — End: 2023-06-21

## 2023-04-16 NOTE — Addendum Note (Signed)
Addended by: Freddy Finner on: 04/16/2023 03:55 PM   Modules accepted: Orders

## 2023-04-16 NOTE — Patient Instructions (Addendum)
Greg Oneill, thank you for joining Freddy Finner, NP for today's virtual visit.  While this provider is not your primary care provider (PCP), if your PCP is located in our provider database this encounter information will be shared with them immediately following your visit.   A [facility] MyChart account gives you access to today's visit and all your visits, tests, and labs performed at [facility] " click here if you don't have a [facility] MyChart account or go to [email or web address]  Consent: (Patient) Greg Oneill provided verbal consent for this virtual visit at the beginning of the encounter.  Current Medications:  Current Outpatient Medications:    amoxicillin-clavulanate (AUGMENTIN) 875-125 MG tablet, Take 1 tablet by mouth 2 (two) times daily for 10 days., Disp: 20 tablet, Rfl: 0   alfuzosin (UROXATRAL) 10 MG 24 hr tablet, Take 10 mg by mouth daily with breakfast., Disp: , Rfl:    ezetimibe-simvastatin (VYTORIN) 10-20 MG per tablet, Take 1 tablet by mouth at bedtime., Disp: , Rfl:    Multiple Vitamins-Minerals (MEGA MULTI MEN) TABS, Take 1 capsule by mouth daily. 50+, Disp: , Rfl:    sertraline (ZOLOFT) 100 MG tablet, TAKE 1 AND 1/2 TABLETS(150 MG) BY MOUTH DAILY, Disp: 45 tablet, Rfl: 10   telmisartan-hydrochlorothiazide (MICARDIS HCT) 80-12.5 MG tablet, Take 1 tablet by mouth daily., Disp: , Rfl:    tirzepatide (MOUNJARO) 15 MG/0.5ML Pen, Inject 15 mg into the skin once a week., Disp: , Rfl:    TOUJEO SOLOSTAR 300 UNIT/ML SOPN, Inject 20 Units as directed at bedtime., Disp: , Rfl:    XIGDUO XR 04-999 MG TB24, Take 1 tablet by mouth daily., Disp: , Rfl:    Medications ordered in this encounter:  Meds ordered this encounter  Medications   amoxicillin-clavulanate (AUGMENTIN) 875-125 MG tablet    Sig: Take 1 tablet by mouth 2 (two) times daily for 10 days.    Dispense:  20 tablet    Refill:  0    Order Specific Question:   Supervising Provider     Answer:   Merrilee Jansky [id number]     *If you need refills on other medications prior to your next appointment, please contact your pharmacy*  Follow-Up: Call back or seek an in-person evaluation if the symptoms worsen or if the condition fails to improve as anticipated.   Virtual Care [phone]  Other Instructions Eustachian Tube Dysfunction  Eustachian tube dysfunction refers to a condition in which a blockage develops in the narrow passage that connects the middle ear to the back of the nose (eustachian tube). The eustachian tube regulates air pressure in the middle ear by letting air move between the ear and nose. It also helps to drain fluid from the middle ear space. Eustachian tube dysfunction can affect one or both ears. When the eustachian tube does not function properly, air pressure, fluid, or both can build up in the middle ear. What are the causes? This condition occurs when the eustachian tube becomes blocked or cannot open normally. Common causes of this condition include: Ear infections. Colds and other infections that affect the nose, mouth, and throat (upper respiratory tract). Allergies. Irritation from cigarette smoke. Irritation from stomach acid coming up into the esophagus (gastroesophageal reflux). The esophagus is the part of the body that moves food from the mouth to the stomach. Sudden changes in air pressure, such as from descending in an airplane or scuba diving. Abnormal growths in the nose or throat,  such as: Growths that line the nose (nasal polyps). Abnormal growth of cells (tumors). Enlarged tissue at the back of the throat (adenoids). What increases the risk? You are more likely to develop this condition if: You smoke. You are overweight. You are a child who has: Certain birth defects of the mouth, such as cleft palate. Large tonsils or adenoids. What are the signs or symptoms? Common symptoms of this condition include: A  feeling of fullness in the ear. Ear pain. Clicking or popping noises in the ear. Ringing in the ear (tinnitus). Hearing loss. Loss of balance. Dizziness. Symptoms may get worse when the air pressure around you changes, such as when you travel to an area of high elevation, fly on an airplane, or go scuba diving. How is this diagnosed? This condition may be diagnosed based on: Your symptoms. A physical exam of your ears, nose, and throat. Tests, such as those that measure: The movement of your eardrum. Your hearing (audiometry). How is this treated? Treatment depends on the cause and severity of your condition. In mild cases, you may relieve your symptoms by moving air into your ears. This is called "popping the ears." In more severe cases, or if you have symptoms of fluid in your ears, treatment may include: Medicines to relieve congestion (decongestants). Medicines that treat allergies (antihistamines). Nasal sprays or ear drops that contain medicines that reduce swelling (steroids). A procedure to drain the fluid in your eardrum. In this procedure, a small tube may be placed in the eardrum to: Drain the fluid. Restore the air in the middle ear space. A procedure to insert a balloon device through the nose to inflate the opening of the eustachian tube (balloon dilation). Follow these instructions at home: Lifestyle Do not do any of the following until your health care provider approves: Travel to high altitudes. Fly in airplanes. Work in a Estate agent or room. Scuba dive. Do not use any products that contain nicotine or tobacco. These products include cigarettes, chewing tobacco, and vaping devices, such as e-cigarettes. If you need help quitting, ask your health care provider. Keep your ears dry. Wear fitted earplugs during showering and bathing. Dry your ears completely after. General instructions Take over-the-counter and prescription medicines only as told by your health  care provider. Use techniques to help pop your ears as recommended by your health care provider. These may include: Chewing gum. Yawning. Frequent, forceful swallowing. Closing your mouth, holding your nose closed, and gently blowing as if you are trying to blow air out of your nose. Keep all follow-up visits. This is important. Contact a health care provider if: Your symptoms do not go away after treatment. Your symptoms come back after treatment. You are unable to pop your ears. You have: A fever. Pain in your ear. Pain in your head or neck. Fluid draining from your ear. Your hearing suddenly changes. You become very dizzy. You lose your balance. Get help right away if: You have a sudden, severe increase in any of your symptoms. Summary Eustachian tube dysfunction refers to a condition in which a blockage develops in the eustachian tube. It can be caused by ear infections, allergies, inhaled irritants, or abnormal growths in the nose or throat. Symptoms may include ear pain or fullness, hearing loss, or ringing in the ears. Mild cases are treated with techniques to unblock the ears, such as yawning or chewing gum. More severe cases are treated with medicines or procedures. This information is not intended to replace advice  given to you by your health care provider. Make sure you discuss any questions you have with your health care provider. Document Revised: 09/13/2020 Document Reviewed: 09/13/2020 Elsevier Patient Education  2024 Elsevier Inc.    If you have been instructed to have an in-person evaluation today at a local Urgent Care facility, please use the link below. It will take you to a list of all of our available [facility], including address, phone number and hours of operation. Please do not delay care.  [facility]  If you or a family member do not have a primary care provider, use the link below to schedule a visit and establish care. When  you choose a [facility] primary care physician or advanced practice provider, you gain a long-term partner in health. Find a Primary Care Provider  Learn more about [facility]'s in-office and virtual care options: [facility] - Get Care Now

## 2023-04-16 NOTE — Progress Notes (Addendum)
Virtual Visit Consent   [name], you are scheduled for a virtual visit with a [facility] provider today. Just as with appointments in the office, your consent must be obtained to participate. Your consent will be active for this visit and any virtual visit you may have with one of our providers in the next 365 days. If you have a MyChart account, a copy of this consent can be sent to you electronically.  As this is a virtual visit, video technology does not allow for your provider to perform a traditional examination. This may limit your provider's ability to fully assess your condition. If your provider identifies any concerns that need to be evaluated in person or the need to arrange testing (such as labs, EKG, etc.), we will make arrangements to do so. Although advances in technology are sophisticated, we cannot ensure that it will always work on either your end or our end. If the connection with a video visit is poor, the visit may have to be switched to a telephone visit. With either a video or telephone visit, we are not always able to ensure that we have a secure connection.  By engaging in this virtual visit, you consent to the provision of healthcare and authorize for your insurance to be billed (if applicable) for the services provided during this visit. Depending on your insurance coverage, you may receive a charge related to this service.  I need to obtain your verbal consent now. Are you willing to proceed with your visit today? Greg Oneill has provided verbal consent on 04/16/2023 for a virtual visit (video or telephone). Freddy Finner, NP  Date: 04/16/2023 9:18 AM  Virtual Visit via Video Note   I, Freddy Finner, connected with  Greg Oneill  ([id number], 1955-11-15) on 04/16/23 at  9:15 AM EDT by a video-enabled telemedicine application and verified that I am speaking with the correct person using two identifiers.  Location: Patient: Virtual Visit Location  Patient: Home Provider: Virtual Visit Location Provider: Home Office   I discussed the limitations of evaluation and management by telemedicine and the availability of in person appointments. The patient expressed understanding and agreed to proceed.    History of Present Illness: Greg Oneill is a 67 y.o. who identifies as a male who was assigned male at birth, and is being seen today for ear blockage  TM is stucked in a collapse position. Was seen in person 3 weeks ago to make sure not ear wax. Was started on Flonase-  has been on going for 2 weeks now.  Started sudafed 10 days ago. Started Claritin a few days ago as well.   Was seen by ENT years ago- 8 plus but does not recall the treatment he was given at that time for a similar issue  Problems:  Patient Active Problem List   Diagnosis Date Noted   Morbid obesity due to excess calories (HCC) complicated by hbp,dm 05/24/2019   Upper airway cough syndrome 04/11/2019   DOE (dyspnea on exertion) 04/11/2019   Essential hypertension 04/11/2019   Special screening for malignant neoplasms, colon     Allergies: No Known Allergies Medications:  Current Outpatient Medications:    alfuzosin (UROXATRAL) 10 MG 24 hr tablet, Take 10 mg by mouth daily with breakfast., Disp: , Rfl:    ezetimibe-simvastatin (VYTORIN) 10-20 MG per tablet, Take 1 tablet by mouth at bedtime., Disp: , Rfl:    Multiple Vitamins-Minerals (MEGA MULTI MEN) TABS, Take 1  capsule by mouth daily. 50+, Disp: , Rfl:    sertraline (ZOLOFT) 100 MG tablet, TAKE 1 AND 1/2 TABLETS(150 MG) BY MOUTH DAILY, Disp: 45 tablet, Rfl: 10   telmisartan-hydrochlorothiazide (MICARDIS HCT) 80-12.5 MG tablet, Take 1 tablet by mouth daily., Disp: , Rfl:    tirzepatide (MOUNJARO) 15 MG/0.5ML Pen, Inject 15 mg into the skin once a week., Disp: , Rfl:    TOUJEO SOLOSTAR 300 UNIT/ML SOPN, Inject 20 Units as directed at bedtime., Disp: , Rfl:    XIGDUO XR 04-999 MG TB24, Take 1 tablet by mouth  daily., Disp: , Rfl:   Observations/Objective: Patient is well-developed, well-nourished in no acute distress.  Resting comfortably  at home.  Head is normocephalic, atraumatic.  No labored breathing.  Speech is clear and coherent with logical content.  Patient is alert and oriented at baseline.    Assessment and Plan:  1. Tympanic membrane disorder, bilateral  - amoxicillin-clavulanate (AUGMENTIN) 875-125 MG tablet; Take 1 tablet by mouth 2 (two) times daily for 10 days.  Dispense: 20 tablet; Refill: 0   -given the duration and no improvement with ETD first line treatments, will try Augmentin with follow up in person for inner ear check  -needs to get in with ENT again as well -continue all medications flonase and antihistamines   -he called ENT and was told he was on Pred DoSe pack in 2017- so will add this as well   Reviewed side effects, risks and benefits of medication.    Patient acknowledged agreement and understanding of the plan.   Past Medical, Surgical, Social History, Allergies, and Medications have been Reviewed.    Follow Up Instructions: I discussed the assessment and treatment plan with the patient. The patient was provided an opportunity to ask questions and all were answered. The patient agreed with the plan and demonstrated an understanding of the instructions.  A copy of instructions were sent to the patient via MyChart unless otherwise noted below.    The patient was advised to call back or seek an in-person evaluation if the symptoms worsen or if the condition fails to improve as anticipated.  Time:  I spent 10 minutes with the patient via telehealth technology discussing the above problems/concerns.    Freddy Finner, NP

## 2023-04-26 ENCOUNTER — Ambulatory Visit (INDEPENDENT_AMBULATORY_CARE_PROVIDER_SITE_OTHER): Payer: 59 | Admitting: Audiology

## 2023-04-26 ENCOUNTER — Ambulatory Visit (INDEPENDENT_AMBULATORY_CARE_PROVIDER_SITE_OTHER): Payer: 59 | Admitting: Otolaryngology

## 2023-04-26 ENCOUNTER — Encounter (INDEPENDENT_AMBULATORY_CARE_PROVIDER_SITE_OTHER): Payer: Self-pay

## 2023-04-26 VITALS — Ht 72.0 in | Wt 202.0 lb

## 2023-04-26 DIAGNOSIS — H903 Sensorineural hearing loss, bilateral: Secondary | ICD-10-CM

## 2023-04-26 DIAGNOSIS — H9313 Tinnitus, bilateral: Secondary | ICD-10-CM

## 2023-04-26 NOTE — Progress Notes (Signed)
[facility] [address] [place], [place] [address]  Audiological Evaluation   Greg Oneill was referred today for a hearing evaluation by Dr. Janeece Riggers Philomena Doheny.  History: Symptoms Yes Details  Hearing Loss Yes Patient reported bilateral hearing loss.  Tinnitus Yes Patient reported bilateral tinnitus.  Balance Problems No Patient denied any vertigo/imbalance.  Previous ear surgeries No Patient denied previous ear surgeries.    Otoscopy: Right ear: Clear external ear canals and normal landmarks in the tympanic membrane. Left ear: Clear external ear canals and normal landmarks in the tympanic membrane.   Tympanogram: Right ear: Normal external ear canal volume with negative middle ear pressure and normal tympanic membrane compliance (Type C). Left ear: Normal external ear canal volume with negative middle ear pressure and low tympanic membrane compliance (Type C).   Hearing Evaluation: The audiogram was completed using conventional audiometric techniques under headphones with good-fair reliability.   The hearing test results indicate: Right ear: Normal hearing sensitivity from 250-500 Hz sloping to severe sensorineural hearing loss from 1000-8000 Hz. Left ear: Mild sensorineural hearing loss from [id number] Hz sloping to profound sensorineural hearing loss from 2000-8000 Hz.   Speech Recognition Thresholds were obtained at 30 dBHL in the right ear and 40 dBHL masked in the left ear.   Word Recognition Testing was completed using the NU-6 word lists at 70 dBHL in the right ear and at 80 dBHL with 50 dBHL of masking noise in the left ear and the patient scored 100% in the right ear and 92% in the left ear.    Recommendations: Repeat audiogram when changes are perceived or per MD. Patient is a candidate for hearing amplification, consider a hearing aid evaluation. Consider various tinnitus strategies, including the use of a noise generator, hearing aids, or  tinnitus retraining therapy.   [name], AUD, CCC-A 04/26/23

## 2023-04-28 DIAGNOSIS — H903 Sensorineural hearing loss, bilateral: Secondary | ICD-10-CM | POA: Insufficient documentation

## 2023-04-28 DIAGNOSIS — H9313 Tinnitus, bilateral: Secondary | ICD-10-CM | POA: Insufficient documentation

## 2023-04-28 NOTE — Progress Notes (Signed)
Patient ID: Greg Oneill, male   DOB: 04/28/1956, 67 y.o.   MRN: [id number]  CC: Bilateral hearing loss, tinnitus  HPI:  Greg Oneill is a 67 y.o. male who presents today complaining of bilateral hearing loss and tinnitus.  According to the patient, he first noted clogging sensation in his ears 1 month ago.  His hearing was muffled.  He has noted bilateral high-pitched tinnitus.  He was treated with topical sweet oil and Flonase nasal spray.  He was also treated with prednisone and amoxicillin.  He believes his hearing has improved.  Currently he denies any otalgia, otorrhea, or vertigo.  Past Medical History:  Diagnosis Date   Anxiety    Arthritis    bil knees   Depression    Diabetes mellitus without complication (HCC)    Hypercholesteremia    Hypertension     Past Surgical History:  Procedure Laterality Date   BIOPSY  10/27/2022   Procedure: BIOPSY;  Surgeon: Lanelle Bal, DO;  [facility];  Service: Endoscopy;;   COLONOSCOPY N/A 09/24/2017   Procedure: COLONOSCOPY;  Surgeon: West Bali, MD;  [facility];  Service: Endoscopy;  Laterality: N/A;  10:30   COLONOSCOPY WITH PROPOFOL N/A 10/27/2022   Procedure: COLONOSCOPY WITH PROPOFOL;  Surgeon: Lanelle Bal, DO;  [facility];  Service: Endoscopy;  Laterality: N/A;  11:30am, asa 2   CYST EXCISION N/A 02/18/2019   Procedure: EXCISION CYST POSTERIOR NECK AND UPPER BACK;  Surgeon: Harriette Bouillon, MD;  Location: [facility];  Service: General;  Laterality: N/A;   INSERTION OF MESH N/A 08/10/2014   Procedure: INSERTION OF MESH;  Surgeon: Dalia Heading, MD;  Location: [facility];  Service: General;  Laterality: N/A;   PILONIDAL CYST EXCISION  1977   Dr Leona Carry   POLYPECTOMY  09/24/2017   Procedure: POLYPECTOMY;  Surgeon: West Bali, MD;  [facility];  Service: Endoscopy;;  colon   POLYPECTOMY  10/27/2022   Procedure: POLYPECTOMY;  Surgeon: Lanelle Bal, DO;  [facility];  Service: Endoscopy;;   UMBILICAL HERNIA REPAIR N/A 08/10/2014   Procedure: UMBILICAL HERNIORRHAPHY ;  Surgeon: Dalia Heading, MD;  Location: [facility];  Service: General;  Laterality: N/A;    Family History  Problem Relation Age of Onset   Bladder Cancer Father    Parkinson's disease Brother    Colon cancer Neg Hx    Colon polyps Neg Hx     Social History:  reports that he quit smoking about 44 years ago. His smoking use included cigarettes. He started smoking about 58 years ago. He has a 14 pack-year smoking history. He has quit using smokeless tobacco. He reports that he does not drink alcohol and does not use drugs.  Allergies: No Known Allergies  Prior to Admission medications   Medication Sig Start Date End Date Taking? Authorizing Provider  alfuzosin (UROXATRAL) 10 MG 24 hr tablet Take 10 mg by mouth daily with breakfast.   Yes [provider]  ezetimibe-simvastatin (VYTORIN) 10-20 MG per tablet Take 1 tablet by mouth at bedtime.   Yes [provider]  sertraline (ZOLOFT) 100 MG tablet TAKE 1 AND 1/2 TABLETS(150 MG) BY MOUTH DAILY 06/23/21  Yes Mozingo, Thereasa Solo, NP  telmisartan-hydrochlorothiazide (MICARDIS HCT) 80-12.5 MG tablet Take 1 tablet by mouth daily. 05/16/21  Yes [provider]  tirzepatide Greggory Keen) 15 MG/0.5ML Pen Inject 15 mg into the skin once  a week.   Yes [provider]  TOUJEO SOLOSTAR 300 UNIT/ML SOPN Inject 20 Units as directed at bedtime. 07/03/17  Yes [provider]  Multiple Vitamins-Minerals (MEGA MULTI MEN) TABS Take 1 capsule by mouth daily. 50+    [provider]  predniSONE (STERAPRED UNI-PAK 21 TAB) 10 MG (21) TBPK tablet Take as directed on package 04/16/23   Freddy Finner, NP  XIGDUO XR 04-999 MG TB24 Take 1 tablet by mouth daily. 04/19/21   [provider]  gabapentin (NEURONTIN) 300 MG capsule One daily in am 03/25/20 09/29/20  Nyoka Cowden, MD  metFORMIN (GLUCOPHAGE) 500 MG tablet Take 500 mg by mouth 2 (two) times daily.  08/05/14 09/29/20  [provider]  pantoprazole (PROTONIX) 40 MG tablet Take 1 tablet (40 mg total) by mouth daily. Take 30-60 min before first meal of the day 01/07/20 09/29/20  Nyoka Cowden, MD   Height 6' (1.829 m), weight 91.6 kg. Exam: General: Communicates without difficulty, well nourished, no acute distress. Head: Normocephalic, no evidence injury, no tenderness, facial buttresses intact without stepoff. Face/sinus: No tenderness to palpation and percussion. Facial movement is normal and symmetric. Eyes: PERRL, EOMI. No scleral icterus, conjunctivae clear. Neuro: CN II exam reveals vision grossly intact.  No nystagmus at any point of gaze. Ears: Auricles well formed without lesions.  Ear canals are intact without mass or lesion.  No erythema or edema is appreciated.  The TMs are intact without fluid. Nose: External evaluation reveals normal support and skin without lesions.  Dorsum is intact.  Anterior rhinoscopy reveals congested mucosa over anterior aspect of inferior turbinates and intact septum.  No purulence noted. Oral:  Oral cavity and oropharynx are intact, symmetric, without erythema or edema.  Mucosa is moist without lesions. Neck: Full range of motion without pain.  There is no significant lymphadenopathy.  No masses palpable.  Thyroid bed within normal limits to palpation.  Parotid glands and submandibular glands equal bilaterally without mass.  Trachea is midline. Neuro:  CN 2-12 grossly intact.    Audiometric evaluation shows bilateral high-frequency sensorineural hearing loss.  Assessment: 1.  Bilateral high-frequency sensorineural hearing loss, likely secondary to presbycusis. 2.  His ear canals, tympanic membranes, and middle ear spaces are normal.  Plan: 1.  The physical exam findings and the hearing test results are reviewed with the patient. 2.  The patient is reassured that no  cerumen impaction or infection is noted today. 3.  The patient is a candidate for hearing amplification.  The hearing aid options are discussed. 4.  The strategies to cope with tinnitus are discussed. 5.  The patient will return for reevaluation in 1 year.  Greg Oneill 04/28/2023, 6:35 PM

## 2023-05-14 ENCOUNTER — Telehealth (INDEPENDENT_AMBULATORY_CARE_PROVIDER_SITE_OTHER): Payer: Self-pay

## 2023-05-14 NOTE — Telephone Encounter (Signed)
Patient calls and wants more prednisone he has already had 2 rounds of this medication. He says that his hearing is now 75 to 80 % better and every time he takes it the hearing gets better. I explained to him that the doctor plans was to get  hearing aids and that Dr. Suszanne Conners had explain this to him. He also had a hearing test done and there findings was the same. He said they didn't talk about the plan for hearing aids. I told him I would talk to Dr. Suszanne Conners about more prednisone and give him a call back.

## 2023-05-15 ENCOUNTER — Telehealth (INDEPENDENT_AMBULATORY_CARE_PROVIDER_SITE_OTHER): Payer: Self-pay | Admitting: Otolaryngology

## 2023-05-15 NOTE — Telephone Encounter (Signed)
Patient called on 05/14/23 and stated that his hearing is about 80% better when he is taking the prednisone. He requested an other refill . Der, Dr.Teoh I called in Prednisone 10 mg for 6 days at [facility] in [place] on [address].

## 2023-05-23 ENCOUNTER — Telehealth: Payer: Self-pay | Admitting: Adult Health

## 2023-05-23 ENCOUNTER — Encounter: Payer: Self-pay | Admitting: Adult Health

## 2023-05-23 ENCOUNTER — Ambulatory Visit (INDEPENDENT_AMBULATORY_CARE_PROVIDER_SITE_OTHER): Payer: 59 | Admitting: Adult Health

## 2023-05-23 DIAGNOSIS — F411 Generalized anxiety disorder: Secondary | ICD-10-CM | POA: Diagnosis not present

## 2023-05-23 DIAGNOSIS — F331 Major depressive disorder, recurrent, moderate: Secondary | ICD-10-CM

## 2023-05-23 DIAGNOSIS — F429 Obsessive-compulsive disorder, unspecified: Secondary | ICD-10-CM

## 2023-05-23 MED ORDER — SERTRALINE HCL 100 MG PO TABS
ORAL_TABLET | ORAL | 3 refills | Status: DC
Start: 2023-05-23 — End: 2023-05-23

## 2023-05-23 MED ORDER — SERTRALINE HCL 100 MG PO TABS
ORAL_TABLET | ORAL | 3 refills | Status: DC
Start: 2023-05-23 — End: 2024-04-30

## 2023-05-23 NOTE — Telephone Encounter (Signed)
Greg Oneill was seen today and his Sertraline #90 sent to:  [facility] [phone] - [place], [place] - [address] AT [facility] [name]   Phone: [phone]  Fax: [phone]    But he informed staff that he uses [facility]. Could his pharmacy be changed to show this and remove [facility] [phone].

## 2023-05-23 NOTE — Telephone Encounter (Signed)
Sent to [facility] and cancelled at [facility].

## 2023-05-23 NOTE — Progress Notes (Signed)
Greg Oneill [id number] [birth year] 67 y.o.  Subjective:   Patient ID:  Greg Oneill is a 67 y.o. (DOB [birth year]) male.  Chief Complaint: No chief complaint on file.   HPI Greg Oneill presents to the office today for follow-up of GAD, MDD, and OCD.  Describes mood today as "ok". Pleasant. Denies tearfulness. Mood symptoms - denies depression, anxiety and irritability. Denies panic attacks. Denies worry, rumination, and over thinking. Mood is consistent. Stating "I feel like I'm doing good". Feels like Zoloft continues to work well for him - taking 150mg  daily. Stable interest and motivation. Taking medications as prescribed.  Energy levels stable. Active, has a regular exercise routine. Swimming 6 days a week. Walking. Golfing 7 days a week.  Enjoys some usual interests and activities. Married. Lives with wife. Has 3 grown sons. 1 granddaughter. Spending time with family. Appetite adequate - eating less. Weight loss - 206 pounds - Mounjaro.  Sleeps well most nights. Averages 7 to 8 hours. Reports some daytime napping.   Focus and concentration stable. Completing tasks. Managing aspects of household. Retired from post office in 2018-32 years. Denies SI or HI.  Denies AH or VH. Denies self harm. Denies substance use.  Previous medication trials: Lexapro, Wellbutrin, Prozac, Paxil   Flowsheet Row Admission (Discharged) from 10/27/2022 in [place] [facility] ED from 01/14/2022 in [facility] at [facility] ED from 09/29/2020 in [facility] at [facility] RISK CATEGORY No Risk No Risk No Risk        Review of Systems:  Review of Systems  Musculoskeletal:  Negative for gait problem.  Neurological:  Negative for tremors.  Psychiatric/Behavioral:         Please refer to HPI    Medications: I have reviewed the patient's current medications.  Current Outpatient Medications  Medication Sig Dispense Refill   alfuzosin (UROXATRAL) 10 MG  24 hr tablet Take 10 mg by mouth daily with breakfast.     ezetimibe-simvastatin (VYTORIN) 10-20 MG per tablet Take 1 tablet by mouth at bedtime.     Multiple Vitamins-Minerals (MEGA MULTI MEN) TABS Take 1 capsule by mouth daily. 50+     predniSONE (STERAPRED UNI-PAK 21 TAB) 10 MG (21) TBPK tablet Take as directed on package 21 tablet 0   sertraline (ZOLOFT) 100 MG tablet TAKE 1 AND 1/2 TABLETS(150 MG) BY MOUTH DAILY 45 tablet 10   telmisartan-hydrochlorothiazide (MICARDIS HCT) 80-12.5 MG tablet Take 1 tablet by mouth daily.     tirzepatide (MOUNJARO) 15 MG/0.5ML Pen Inject 15 mg into the skin once a week.     TOUJEO SOLOSTAR 300 UNIT/ML SOPN Inject 20 Units as directed at bedtime.     XIGDUO XR 04-999 MG TB24 Take 1 tablet by mouth daily.     No current facility-administered medications for this visit.    Medication Side Effects: None  Allergies: No Known Allergies  Past Medical History:  Diagnosis Date   Anxiety    Arthritis    bil knees   Depression    Diabetes mellitus without complication (HCC)    Hypercholesteremia    Hypertension     Past Medical History, Surgical history, Social history, and Family history were reviewed and updated as appropriate.   Please see review of systems for further details on the patient's review from today.   Objective:   Physical Exam:  There were no vitals taken for this visit.  Physical Exam Constitutional:      General: He  is not in acute distress. Musculoskeletal:        General: No deformity.  Neurological:     Mental Status: He is alert and oriented to person, place, and time.     Coordination: Coordination normal.  Psychiatric:        Attention and Perception: Attention and perception normal. He does not perceive auditory or visual hallucinations.        Mood and Affect: Mood normal. Mood is not anxious or depressed. Affect is not labile, blunt, angry or inappropriate.        Speech: Speech normal.        Behavior: Behavior  normal.        Thought Content: Thought content normal. Thought content is not paranoid or delusional. Thought content does not include homicidal or suicidal ideation. Thought content does not include homicidal or suicidal plan.        Cognition and Memory: Cognition and memory normal.        Judgment: Judgment normal.     Comments: Insight intact     Lab Review:     Component Value Date/Time   NA 134 (L) 02/14/2019 1013   K 4.4 02/14/2019 1013   CL 100 02/14/2019 1013   CO2 24 02/14/2019 1013   GLUCOSE 151 (H) 02/14/2019 1013   BUN 12 02/14/2019 1013   CREATININE 1.07 02/14/2019 1013   CALCIUM 8.8 (L) 02/14/2019 1013   PROT 6.5 02/14/2019 1013   ALBUMIN 4.0 02/14/2019 1013   AST 37 02/14/2019 1013   ALT 29 02/14/2019 1013   ALKPHOS 67 02/14/2019 1013   BILITOT 1.8 (H) 02/14/2019 1013   GFRNONAA >60 02/14/2019 1013   GFRAA >60 02/14/2019 1013       Component Value Date/Time   WBC 6.7 04/11/2019 1507   RBC 4.76 04/11/2019 1507   HGB 15.3 04/11/2019 1507   HCT 44.1 04/11/2019 1507   PLT 203.0 04/11/2019 1507   MCV 92.7 04/11/2019 1507   MCH 31.1 02/14/2019 1013   MCHC 34.6 04/11/2019 1507   RDW 14.1 04/11/2019 1507   LYMPHSABS 1.5 04/11/2019 1507   MONOABS 0.5 04/11/2019 1507   EOSABS 0.1 04/11/2019 1507   BASOSABS 0.0 04/11/2019 1507    No results found for: "POCLITH", "LITHIUM"   No results found for: "PHENYTOIN", "PHENOBARB", "VALPROATE", "CBMZ"   .res Assessment: Plan:    Plan:  PDMP reviewed  1.  Zoloft 150mg  daily  RTC 1 year  Patient advised to contact office with any questions, adverse effects, or acute worsening in signs and symptoms.  There are no diagnoses linked to this encounter.   Please see After Visit Summary for patient specific instructions.  Future Appointments  Date Time Provider Department Center  05/23/2023  1:00 PM Hiawatha Dressel, Thereasa Solo, NP [facility] None  06/07/2023  2:00 PM Ashok Croon, MD [facility] None    No orders of  the defined types were placed in this encounter.   -------------------------------

## 2023-06-07 ENCOUNTER — Encounter (INDEPENDENT_AMBULATORY_CARE_PROVIDER_SITE_OTHER): Payer: Self-pay | Admitting: Otolaryngology

## 2023-06-07 ENCOUNTER — Ambulatory Visit (INDEPENDENT_AMBULATORY_CARE_PROVIDER_SITE_OTHER): Payer: 59 | Admitting: Otolaryngology

## 2023-06-07 VITALS — BP 159/71 | HR 62 | Ht 72.0 in | Wt 209.0 lb

## 2023-06-07 DIAGNOSIS — D109 Benign neoplasm of pharynx, unspecified: Secondary | ICD-10-CM | POA: Diagnosis not present

## 2023-06-07 DIAGNOSIS — K219 Gastro-esophageal reflux disease without esophagitis: Secondary | ICD-10-CM

## 2023-06-07 DIAGNOSIS — R0981 Nasal congestion: Secondary | ICD-10-CM

## 2023-06-07 DIAGNOSIS — R09A2 Foreign body sensation, throat: Secondary | ICD-10-CM

## 2023-06-07 DIAGNOSIS — R0982 Postnasal drip: Secondary | ICD-10-CM

## 2023-06-07 DIAGNOSIS — H9190 Unspecified hearing loss, unspecified ear: Secondary | ICD-10-CM

## 2023-06-07 DIAGNOSIS — R0989 Other specified symptoms and signs involving the circulatory and respiratory systems: Secondary | ICD-10-CM

## 2023-06-07 MED ORDER — FAMOTIDINE 20 MG PO TABS
20.0000 mg | ORAL_TABLET | Freq: Two times a day (BID) | ORAL | 3 refills | Status: DC
Start: 2023-06-07 — End: 2023-07-17

## 2023-06-07 NOTE — Progress Notes (Signed)
ENT CONSULT:  Reason for Consult: globus sensation and throat clearing    HPI: Discussed the use of AI scribe software for clinical note transcription with the patient, who gave verbal consent to proceed.  History of Present Illness   The patient is a 64 yoM, with a history of age-related hearing decline (seeing Dr Suszanne Conners for hearing loss), presents with concerns about a sensation of a lump in his throat and frequent throat clearing, particularly at night. He reports snoring and nasal congestion, often with one nostril blocked. He denies throat pain and cough, and has no known allergies. He has been on [facility] for type 2 diabetes for approximately 6-8 months, during which he has lost significant weight.  Record review indicates he was previously f/b Pulm Dr Sherene Sires for chronic cough and emphysema.   In addition, the patient has a history of reflux, for which he has been treated with Protonix in the past. He does not report typical symptoms of heartburn, but has been told he has reflux.  The patient has been seen by a pulmonologist due to concerns of emphysema, which was ruled out. He has a follow-up appointment scheduled with the pulmonologist in December. Previously told he has "irritable larynx syndrome" but denies cough to me today.     Records Reviewed:  Dr Thurston Hole office visit note 03/25/20  Brief patient profile:  63 yowm smoked 15 years stopping in 1980 s obvious sequelae then started coughing early 40s esp at bedtime year round and referred to pulmonary clinic 04/11/2019 by  Rosezella [place] for abn cxr suggesting copd done 03/17/2019   Consult      Cough for last 15 - 20 years. Possible emphysema.  Dyspnea:  Can swim one lap then stops x 5 years / basketball ok but mostly just shooting hoops / no full court  Cough: dry esp at hs  Sleep: bed is flat  SABA use: none - ? Some better on symbicort  rec Stop lisinopril  And start micardis 40 mg one daily in its place For cough take tessalon  200mg  one every 6-8 hours  Pantoprazole (protonix) 40 mg   Take  30-60 min before first meal of the day and Pepcid (famotidine)  20 mg one @  bedtime until return to office - this is the best way to tell whether stomach acid is contributing to your problem.   GERD diet     05/23/2019  f/u ov/Wert re:  Uacs/   cxr c/w "emphysema"      Chief Complaint  Patient presents with   Follow-up      PFT done today. Cough is much improved.   Dyspnea:  One lap at pool then rest 15 secs and keep going to complete 30-40 min total  Cough: better to his satisfaction Sleeping: bed is flat two pillows  SABA use: none  02: none  rec For drainage / throat tickle try take CHLORPHENIRAMINE  4 mg  (Chlortab 4mg   at [employer] should be easiest to find in the green box)  take one every 4 hours as needed    We can call in gabapentin 100 mg three times a day if not making progress for diagnosis of irritable larynx syndrome  08/22/2019  f/u ov/Wert re: uacs hbp      Chief Complaint  Patient presents with   Follow-up      Patient states that his cough and clearing of his throat have got better since last visit. Patient is having issues  with his BP and blood sugar was seen in the ED 08/20/2019.  Dyspnea: swimming fine ex tol Cough: much better, still some throat clearing , only using h1 at hs  Sleeping: fine flat one pillow SABA use: none 02: none  rec Double your telmisartan to 40 mg x 2 = 80 mg one daily  For drainage / throat tickle try take CHLORPHENIRAMINE  4 mg   Gabapentin 100 mg three times a day trial basis and see me in 6 weeks - if it makes you feel fuzzy headed then start with one daily x one week, then one twice daily x one week, then one three times a day until reutnr   GERD  Diet .     Past Medical History:  Diagnosis Date   Anxiety    Arthritis    bil knees   Depression    Diabetes mellitus without complication (HCC)    Hypercholesteremia    Hypertension     Past Surgical  History:  Procedure Laterality Date   BIOPSY  10/27/2022   Procedure: BIOPSY;  Surgeon: Lanelle Bal, DO;  [facility];  Service: Endoscopy;;   COLONOSCOPY N/A 09/24/2017   Procedure: COLONOSCOPY;  Surgeon: West Bali, MD;  Location: [facility];  Service: Endoscopy;  Laterality: N/A;  10:30   COLONOSCOPY WITH PROPOFOL N/A 10/27/2022   Procedure: COLONOSCOPY WITH PROPOFOL;  Surgeon: Lanelle Bal, DO;  Location: [facility];  Service: Endoscopy;  Laterality: N/A;  11:30am, asa 2   CYST EXCISION N/A 02/18/2019   Procedure: EXCISION CYST POSTERIOR NECK AND UPPER BACK;  Surgeon: Harriette Bouillon, MD;  Location: [facility];  Service: General;  Laterality: N/A;   INSERTION OF MESH N/A 08/10/2014   Procedure: INSERTION OF MESH;  Surgeon: Dalia Heading, MD;  Location: [facility];  Service: General;  Laterality: N/A;   PILONIDAL CYST EXCISION  1977   Dr Leona Carry   POLYPECTOMY  09/24/2017   Procedure: POLYPECTOMY;  Surgeon: West Bali, MD;  Location: [facility];  Service: Endoscopy;;  colon   POLYPECTOMY  10/27/2022   Procedure: POLYPECTOMY;  Surgeon: Lanelle Bal, DO;  Location: [facility];  Service: Endoscopy;;   UMBILICAL HERNIA REPAIR N/A 08/10/2014   Procedure: UMBILICAL HERNIORRHAPHY ;  Surgeon: Dalia Heading, MD;  Location: [facility];  Service: General;  Laterality: N/A;    Family History  Problem Relation Age of Onset   Bladder Cancer Father    Parkinson's disease Brother    Colon cancer Neg Hx    Colon polyps Neg Hx     Social History:  reports that he quit smoking about 44 years ago. His smoking use included cigarettes. He started smoking about 58 years ago. He has a 14 pack-year smoking history. He has quit using smokeless tobacco. He reports that he does not drink alcohol and does not use drugs.  Allergies: No Known Allergies  Medications: I have reviewed the patient's current medications.  The PMH, PSH, Medications, Allergies, and  SH were reviewed and updated.  ROS: Constitutional: Negative for fever, weight loss and weight gain. Cardiovascular: Negative for chest pain and dyspnea on exertion. Respiratory: Is not experiencing shortness of breath at rest. Gastrointestinal: Negative for nausea and vomiting. Neurological: Negative for headaches. Psychiatric: The patient is not nervous/anxious  Blood pressure (!) 159/71, pulse 62, height 6' (1.829 m), weight 209 lb (94.8 kg), SpO2 99%.  PHYSICAL EXAM:  Exam: General: Well-developed, well-nourished Respiratory  Respiratory effort: Equal inspiration and expiration without stridor Cardiovascular Peripheral Vascular: Warm extremities with equal color/perfusion Eyes: No nystagmus with equal extraocular motion bilaterally Neuro/Psych/Balance: Patient oriented to person, place, and time; Appropriate mood and affect; Gait is intact with no imbalance; Cranial nerves I-XII are intact Head and Face Inspection: Normocephalic and atraumatic without mass or lesion Palpation: Facial skeleton intact without bony stepoffs Salivary Glands: No mass or tenderness Facial Strength: Facial motility symmetric and full bilaterally ENT Pinna: External ear intact and fully developed External canal: Canal is patent with intact skin Tympanic Membrane: Clear and mobile External Nose: No scar or anatomic deformity Internal Nose: Septum is slightly deviated to the left. No polyp, or purulence. Mucosal edema and erythema present.  Bilateral inferior turbinate hypertrophy.  Lips, Teeth, and gums: Mucosa and teeth intact and viable TMJ: No pain to palpation with full mobility Oral cavity/oropharynx: No erythema or exudate, no lesions present. Small papilloma posterior left tonsil posterior tonsillar pillar area Nasopharynx: No mass or lesion with intact mucosa Hypopharynx: Intact mucosa without pooling of secretions Larynx Glottic: Full true vocal cord mobility without lesion or mass (+)  pseudo-sulcus Supraglottic: Normal appearing epiglottis and AE folds Interarytenoid Space: Moderate to severe pachydermia/edema Subglottic Space: Patent without lesion or edema Neck Neck and Trachea: Midline trachea without mass or lesion Thyroid: No mass or nodularity Lymphatics: No lymphadenopathy  Procedure: Preoperative diagnosis: globus sensation and throat clearing  Postoperative diagnosis:   Same + GERD LPR  Procedure: Flexible fiberoptic laryngoscopy  Surgeon: Ashok Croon, MD  Anesthesia: Topical lidocaine and Afrin Complications: None Condition is stable throughout exam  Indications and consent:  The patient presents to the clinic with Indirect laryngoscopy view was incomplete. Thus it was recommended that they undergo a flexible fiberoptic laryngoscopy. All of the risks, benefits, and potential complications were reviewed with the patient preoperatively and verbal informed consent was obtained.  Procedure: The patient was seated upright in the clinic. Topical lidocaine and Afrin were applied to the nasal cavity. After adequate anesthesia had occurred, I then proceeded to pass the flexible telescope into the nasal cavity. The nasal cavity was patent without rhinorrhea or polyp. The nasopharynx was also patent without mass or lesion. The base of tongue was visualized and was normal. There were no signs of pooling of secretions in the piriform sinuses. The true vocal folds were mobile bilaterally. There were no signs of glottic or supraglottic mucosal lesion or mass. There was severe interarytenoid pachydermia and post cricoid edema. The telescope was then slowly withdrawn and the patient tolerated the procedure throughout.  Studies Reviewed:CXR 09/29/20 FINDINGS: The heart size and mediastinal contours are within normal limits. Probable emphysematous change. Dextroscoliosis of the thoracic spine with associated disc degenerative disease and ankylosis.    IMPRESSION: Probable emphysema without acute abnormality of the lungs. No focal airspace opacity.  Assessment/Plan: Encounter Diagnoses  Name Primary?   Globus sensation    Chronic GERD    Chronic throat clearing Yes   Chronic nasal congestion    Post-nasal drip     Assessment and Plan Chronic GERD LPR  - with symptoms of throat clearing, globus sensation, and mild nasal congestion, worse at night. Examination including flexible scope exam c/w it revealing significant post-cricoid edema/pachydermia and pseudo-sulcus. On Mounjaro, used for type 2 diabetes, may exacerbate reflux. Discussed dietary factors and management options. - Prescribe famotidine (Pepcid) 20 mg BID to try - Recommended a trial of Reflux Gourmet - Advised dietary modifications: avoid late meals, caffeine, spicy foods,  fried foods, chocolate, and alcohol - Provided educational resource link on reflux in after-visit summary  Papilloma in Throat Small benign papilloma observed in the throat left posterior tonsillar pillar seen on flexible scope exam. No immediate intervention required. - Monitor for changes or symptoms  Age-Related Hearing Loss Age-related hearing loss - seeing Dr Suszanne Conners for it. - RTC with Dr Suszanne Conners as scheduled in December  Nasal congestion - suspect environmental vs seasonal allergies  - Flonase 2 puffs b/l nares BID   Thank you for allowing me to participate in the care of this patient. Please do not hesitate to contact me with any questions or concerns.   Ashok Croon, MD Otolaryngology [facility] Phone: [phone] Fax: [phone]  06/07/2023, 2:29 PM

## 2023-06-07 NOTE — Patient Instructions (Signed)
Please read about reflux using the following link/website  [email or web address]  - start Famotidine for reflux  -  Take Reflux Gourmet (natural supplement available on [employer]) to help with symptoms of chronic throat irritation

## 2023-06-18 ENCOUNTER — Ambulatory Visit (INDEPENDENT_AMBULATORY_CARE_PROVIDER_SITE_OTHER): Payer: 59 | Admitting: Audiology

## 2023-06-18 ENCOUNTER — Encounter (INDEPENDENT_AMBULATORY_CARE_PROVIDER_SITE_OTHER): Payer: Self-pay

## 2023-06-18 ENCOUNTER — Ambulatory Visit (INDEPENDENT_AMBULATORY_CARE_PROVIDER_SITE_OTHER): Payer: 59 | Admitting: Otolaryngology

## 2023-06-18 VITALS — Ht 72.0 in | Wt 207.0 lb

## 2023-06-18 DIAGNOSIS — H903 Sensorineural hearing loss, bilateral: Secondary | ICD-10-CM | POA: Diagnosis not present

## 2023-06-18 DIAGNOSIS — H6983 Other specified disorders of Eustachian tube, bilateral: Secondary | ICD-10-CM | POA: Diagnosis not present

## 2023-06-18 DIAGNOSIS — H9313 Tinnitus, bilateral: Secondary | ICD-10-CM

## 2023-06-18 NOTE — Progress Notes (Signed)
  [address] [place], [place] [address] [phone]  Audiological Evaluation    Name: Greg Oneill     DOB:   25-Mar-1956      MRN:   [id number]                                                                                     Service Date: 06/18/2023     Accompanied by: none   Patient comes today after Dr. Suszanne Conners, ENT sent a referral for a hearing evaluation after he was treated with prednisone.   Symptoms Yes Details  Hearing loss  [x]  Notices hearing may be somewhat better. Previous test completed on 04-26-23 demonstrated a decline in hearing when compared to his test on file from 02-07-16.  Tinnitus  [x]  Seems louder, per patient report and it comes and goes  Ear pain/ Ear infections  [x]  Hears crackling in his ears  Balance problems  []    Noise exposure  []    Previous ear surgeries  []    Family history  []    Amplification  []    Other  [x]  Reports he suffers from acid reflux    Otoscopy: Right ear: Clear external ear canals and notable landmarks visualized on the tympanic membrane. Left ear:  Occluding cerumen, unable to visualize notable tympanic membrane landmarks.  Tympanometry: Right ear: Type C- Normal external ear canal volume with negative middle ear pressure and normal tympanic membrane compliance Left ear: Type As- Normal external ear canal volume with normal middle ear pressure and low tympanic membrane compliance  Pure tone Audiometry: Right ear- Normal to profound essentially sensorineural hearing loss from [phone] Hz, with an air-bone gap at 4000 Hz.   Left ear- Mild to severe sensorineural hearing loss from 250 Hz - 8000 Hz.  The hearing test results were completed under headphones and re-checked with inserts and results are deemed to be of good reliability. Test technique:  conventional     Speech Audiometry: Right ear- Speech Reception Threshold (SRT) was obtained at 35 dBHL Left ear-Speech Reception Threshold (SRT) was obtained at  40 dBHL   Word Recognition Score Tested using NU-6 (MLV) Right ear: 88% was obtained at a presentation level of 80 dBHL with contralateral masking which is deemed as  good  Left ear: 80% was obtained at a presentation level of 80 dBHL with contralateral masking which is deemed as  good     Impression: There is not an overall significant difference when today's results are compared to his previous audiogram. There is not a significant difference in the word recognition score in between ears.    Recommendations: Follow up with ENT as scheduled for today. Return for a hearing evaluation if concerns with hearing changes arise or per MD recommendation. Consider a communication needs assessment after medical clearance for hearing aids is obtained.   Temperance Kelemen MARIE LEROUX-MARTINEZ, AUD

## 2023-06-19 DIAGNOSIS — H6983 Other specified disorders of Eustachian tube, bilateral: Secondary | ICD-10-CM | POA: Insufficient documentation

## 2023-06-19 NOTE — Addendum Note (Signed)
Addended byNewman Pies on: 06/19/2023 08:36 AM   Modules accepted: Orders

## 2023-06-19 NOTE — Progress Notes (Signed)
Patient ID: LIAD MIU, male   DOB: 1955/10/13, 67 y.o.   MRN: [id number]  Follow-up: Bilateral hearing loss, tinnitus, clogging sensation in ears  HPI: The patient is a 67 year old male who returns today for his follow-up evaluation.  He was last seen 2 months ago.  At that time, he was complaining of clogging sensation in his ears, hearing loss, and tinnitus.  He was treated with Flonase nasal spray, prednisone, and amoxicillin.  His symptoms mildly improved.  At his last appointment, he was noted to have bilateral high-frequency sensorineural hearing loss, likely secondary to routine presbycusis.  His symptoms were consistent with eustachian tube dysfunction.  He was continue on his Flonase nasal spray.  The strategies to cope with sleep tinnitus were also discussed.  The patient returns today complaining of persistent clogging sensation in his ears.  He is still having hearing difficulty and bilateral high-pitched tinnitus.  He denies any significant otalgia, otorrhea, or vertigo.  Exam: General: Communicates without difficulty, well nourished, no acute distress. Head: Normocephalic, no evidence injury, no tenderness, facial buttresses intact without stepoff. Face/sinus: No tenderness to palpation and percussion. Facial movement is normal and symmetric. Eyes: PERRL, EOMI. No scleral icterus, conjunctivae clear. Neuro: CN II exam reveals vision grossly intact.  No nystagmus at any point of gaze. Ears: Auricles well formed without lesions.  Ear canals are intact without mass or lesion.  No erythema or edema is appreciated.  The TMs are intact without fluid. Nose: External evaluation reveals normal support and skin without lesions.  Dorsum is intact.  Anterior rhinoscopy reveals congested mucosa over anterior aspect of inferior turbinates and intact septum.  No purulence noted. Oral:  Oral cavity and oropharynx are intact, symmetric, without erythema or edema.  Mucosa is moist without lesions. Neck:  Full range of motion without pain.  There is no significant lymphadenopathy.  No masses palpable.  Thyroid bed within normal limits to palpation.  Parotid glands and submandibular glands equal bilaterally without mass.  Trachea is midline. Neuro:  CN 2-12 grossly intact.     Audiometric evaluation shows stable bilateral high-frequency sensorineural hearing loss.   Assessment: 1.  Stable bilateral high-frequency sensorineural hearing loss, likely secondary to presbycusis. 2.  His ear canals, tympanic membranes, and middle ear spaces are normal. 3.  Clinical eustachian tube dysfunction.   Plan: 1.  The physical exam findings and the hearing test results are reviewed with the patient. 2.  The patient is reassured that his hearing thresholds are stable. 3.  Continue with Flonase nasal spray daily.  Valsalva exercises encouraged. 4.  The patient is a candidate for hearing amplification.  The hearing aid options are discussed. 4.  The patient will return for reevaluation in 1 year.

## 2023-06-20 ENCOUNTER — Telehealth: Payer: Self-pay | Admitting: Orthopaedic Surgery

## 2023-06-20 NOTE — Telephone Encounter (Signed)
Dr. Sanjuan Dame pt - pt was seen by Dr. Hilda Lias' years ago and would like to see him for his lt shoulder pain.  He is going to get his notes, report and x-rays from the chiropractor and drop them off and schedule.

## 2023-06-21 ENCOUNTER — Encounter: Payer: Self-pay | Admitting: Internal Medicine

## 2023-06-21 ENCOUNTER — Ambulatory Visit: Payer: 59 | Admitting: Internal Medicine

## 2023-06-21 VITALS — BP 115/73 | HR 80 | Temp 98.6°F | Ht 72.0 in | Wt 210.0 lb

## 2023-06-21 DIAGNOSIS — R0989 Other specified symptoms and signs involving the circulatory and respiratory systems: Secondary | ICD-10-CM | POA: Diagnosis not present

## 2023-06-21 DIAGNOSIS — K219 Gastro-esophageal reflux disease without esophagitis: Secondary | ICD-10-CM | POA: Diagnosis not present

## 2023-06-21 MED ORDER — PANTOPRAZOLE SODIUM 40 MG PO TBEC: 40.0000 mg | DELAYED_RELEASE_TABLET | Freq: Two times a day (BID) | ORAL | 11 refills | Status: DC

## 2023-06-21 NOTE — Patient Instructions (Signed)
I am going to start you on a new medication called pantoprazole 40 mg twice daily.  You can stop your famotidine.  Follow-up in 3 months.  We can consider schedule you for upper endoscopy at that time if symptoms not improved.  It was very nice seeing you again today.  Dr. Marletta Lor

## 2023-06-21 NOTE — Progress Notes (Unsigned)
Referring Provider: Benita Stabile, MD Primary Care Physician:  Benita Stabile, MD Primary GI:  Dr. Marletta Lor  Chief Complaint  Patient presents with   New Patient (Initial Visit)    Pt referred for acid reflux destroying your teeth    HPI:   Greg Oneill is a 67 y.o. male who presents   Past Medical History:  Diagnosis Date   Anxiety    Arthritis    bil knees   Depression    Diabetes mellitus without complication (HCC)    Hypercholesteremia    Hypertension     Past Surgical History:  Procedure Laterality Date   BIOPSY  10/27/2022   Procedure: BIOPSY;  Surgeon: Lanelle Bal, DO;  [facility]: [facility];  Service: Endoscopy;;   COLONOSCOPY N/A 09/24/2017   Procedure: COLONOSCOPY;  Surgeon: West Bali, MD;  [facility]: [facility];  Service: Endoscopy;  Laterality: N/A;  10:30   COLONOSCOPY WITH PROPOFOL N/A 10/27/2022   Procedure: COLONOSCOPY WITH PROPOFOL;  Surgeon: Lanelle Bal, DO;  [facility]: [facility];  Service: Endoscopy;  Laterality: N/A;  11:30am, asa 2   CYST EXCISION N/A 02/18/2019   Procedure: EXCISION CYST POSTERIOR NECK AND UPPER BACK;  Surgeon: Harriette Bouillon, MD;  Location: [facility];  Service: General;  Laterality: N/A;   INSERTION OF MESH N/A 08/10/2014   Procedure: INSERTION OF MESH;  Surgeon: Dalia Heading, MD;  Location: [facility];  Service: General;  Laterality: N/A;   PILONIDAL CYST EXCISION  1977   Dr Leona Carry   POLYPECTOMY  09/24/2017   Procedure: POLYPECTOMY;  Surgeon: West Bali, MD;  [facility]: [facility];  Service: Endoscopy;;  colon   POLYPECTOMY  10/27/2022   Procedure: POLYPECTOMY;  Surgeon: Lanelle Bal, DO;  [facility]: [facility];  Service: Endoscopy;;   UMBILICAL HERNIA REPAIR N/A 08/10/2014   Procedure: UMBILICAL HERNIORRHAPHY ;  Surgeon: Dalia Heading, MD;  Location: [facility];  Service: General;  Laterality: N/A;    Current Outpatient Medications  Medication Sig Dispense Refill   alfuzosin  (UROXATRAL) 10 MG 24 hr tablet Take 10 mg by mouth daily with breakfast.     ezetimibe-simvastatin (VYTORIN) 10-20 MG per tablet Take 1 tablet by mouth at bedtime.     famotidine (PEPCID) 20 MG tablet Take 1 tablet (20 mg total) by mouth 2 (two) times daily. 30 tablet 3   sertraline (ZOLOFT) 100 MG tablet Take one and 1/2 tablets daily. 135 tablet 3   telmisartan-hydrochlorothiazide (MICARDIS HCT) 80-12.5 MG tablet Take 1 tablet by mouth daily.     tirzepatide (MOUNJARO) 15 MG/0.5ML Pen Inject 15 mg into the skin once a week.     TOUJEO SOLOSTAR 300 UNIT/ML SOPN Inject 20 Units as directed at bedtime.     XIGDUO XR 04-999 MG TB24 Take 1 tablet by mouth daily.     Multiple Vitamins-Minerals (MEGA MULTI MEN) TABS Take 1 capsule by mouth daily. 50+ (Patient not taking: Reported on 06/07/2023)     predniSONE (STERAPRED UNI-PAK 21 TAB) 10 MG (21) TBPK tablet Take as directed on package 21 tablet 0   No current facility-administered medications for this visit.    Allergies as of 06/21/2023   (No Known Allergies)    Family History  Problem Relation Age of Onset   Bladder Cancer Father    Parkinson's disease Brother    Colon cancer Neg Hx    Colon polyps Neg Hx  Social History   Socioeconomic History   Marital status: Married    Spouse name: Not on file   Number of children: Not on file   Years of education: Not on file   Highest education level: Not on file  Occupational History   Not on file  Tobacco Use   Smoking status: Former    Current packs/day: 0.00    Average packs/day: 1 pack/day for 14.0 years (14.0 ttl pk-yrs)    Types: Cigarettes    Start date: 07/17/1964    Quit date: 07/17/1978    Years since quitting: 44.9   Smokeless tobacco: Former  Building services engineer status: Never Used  Substance and Sexual Activity   Alcohol use: No   Drug use: No   Sexual activity: Yes  Other Topics Concern   Not on file  Social History Narrative   Not on file   Social  Determinants of Health   Financial Resource Strain: Not on file  Food Insecurity: Not on file  Transportation Needs: Not on file  Physical Activity: Not on file  Stress: Not on file  Social Connections: Not on file    Subjective: ROS   Objective: BP 115/73   Pulse 80   Temp 98.6 F (37 C)   Ht 6' (1.829 m)   Wt 210 lb (95.3 kg)   BMI 28.48 kg/m  Physical Exam   Assessment: *  Plan:   06/21/2023 2:38 PM   Disclaimer: This note was dictated with voice recognition software. Similar sounding words can inadvertently be transcribed and may not be corrected upon review.

## 2023-06-22 ENCOUNTER — Telehealth: Payer: Self-pay | Admitting: Orthopaedic Surgery

## 2023-06-22 NOTE — Telephone Encounter (Signed)
DR. Hilda Lias  Patient came in the office requesting a injection in his shoulder.  He called on 06/20/23 and spoke with [facility] and she advised him what all we needed on paper and CD with all his XRAY, MRI, CT office notes.  He said what if I didn't tell you I had went to a chiropractor.  I told him he would be a new patient and would need a referral to come and see [place] and we would still need all the information from the chiropractor too.

## 2023-06-26 ENCOUNTER — Encounter (HOSPITAL_BASED_OUTPATIENT_CLINIC_OR_DEPARTMENT_OTHER): Payer: Self-pay | Admitting: Student

## 2023-06-26 ENCOUNTER — Ambulatory Visit (HOSPITAL_BASED_OUTPATIENT_CLINIC_OR_DEPARTMENT_OTHER): Payer: 59

## 2023-06-26 ENCOUNTER — Ambulatory Visit (HOSPITAL_BASED_OUTPATIENT_CLINIC_OR_DEPARTMENT_OTHER): Payer: 59 | Admitting: Student

## 2023-06-26 DIAGNOSIS — M25512 Pain in left shoulder: Secondary | ICD-10-CM

## 2023-06-26 DIAGNOSIS — M7582 Other shoulder lesions, left shoulder: Secondary | ICD-10-CM | POA: Diagnosis not present

## 2023-06-26 MED ORDER — LIDOCAINE HCL 1 % IJ SOLN
4.0000 mL | INTRAMUSCULAR | Status: AC | PRN
Start: 2023-06-26 — End: 2023-06-26
  Administered 2023-06-26: 4 mL

## 2023-06-26 MED ORDER — TRIAMCINOLONE ACETONIDE 40 MG/ML IJ SUSP
2.0000 mL | INTRAMUSCULAR | Status: AC | PRN
Start: 2023-06-26 — End: 2023-06-26
  Administered 2023-06-26: 2 mL via INTRA_ARTICULAR

## 2023-06-26 NOTE — Progress Notes (Signed)
Chief Complaint: Left shoulder pain     History of Present Illness:    Greg Oneill is a 67 y.o. right-hand-dominant male here today for evaluation of left shoulder pain.  This is been ongoing for the last 3 to 4 months.  Denies any specific injury but does note that he had been under the car changing his oil when he first noticed the pain.  Pain is located mainly in the superior and lateral shoulder and does not radiate.  He rates pain at a 5/10 which is worsened with range of motion.  He does typically swim about 6 times per week for exercise.   Surgical History:   None  PMH/PSH/Family History/Social History/Meds/Allergies:    Past Medical History:  Diagnosis Date   Anxiety    Arthritis    bil knees   Depression    Diabetes mellitus without complication (HCC)    Hypercholesteremia    Hypertension    Past Surgical History:  Procedure Laterality Date   BIOPSY  10/27/2022   Procedure: BIOPSY;  Surgeon: Lanelle Bal, DO;  [facility];  Service: Endoscopy;;   COLONOSCOPY N/A 09/24/2017   Procedure: COLONOSCOPY;  Surgeon: West Bali, MD;  [facility];  Service: Endoscopy;  Laterality: N/A;  10:30   COLONOSCOPY WITH PROPOFOL N/A 10/27/2022   Procedure: COLONOSCOPY WITH PROPOFOL;  Surgeon: Lanelle Bal, DO;  [facility];  Service: Endoscopy;  Laterality: N/A;  11:30am, asa 2   CYST EXCISION N/A 02/18/2019   Procedure: EXCISION CYST POSTERIOR NECK AND UPPER BACK;  Surgeon: Harriette Bouillon, MD;  Location: [facility];  Service: General;  Laterality: N/A;   INSERTION OF MESH N/A 08/10/2014   Procedure: INSERTION OF MESH;  Surgeon: Dalia Heading, MD;  Location: [facility];  Service: General;  Laterality: N/A;   PILONIDAL CYST EXCISION  1977   Dr Leona Carry   POLYPECTOMY  09/24/2017   Procedure: POLYPECTOMY;  Surgeon: West Bali, MD;  [facility];  Service: Endoscopy;;   colon   POLYPECTOMY  10/27/2022   Procedure: POLYPECTOMY;  Surgeon: Lanelle Bal, DO;  [facility];  Service: Endoscopy;;   UMBILICAL HERNIA REPAIR N/A 08/10/2014   Procedure: UMBILICAL HERNIORRHAPHY ;  Surgeon: Dalia Heading, MD;  Location: [facility];  Service: General;  Laterality: N/A;   Social History   Socioeconomic History   Marital status: Married    Spouse name: Not on file   Number of children: Not on file   Years of education: Not on file   Highest education level: Not on file  Occupational History   Not on file  Tobacco Use   Smoking status: Former    Current packs/day: 0.00    Average packs/day: 1 pack/day for 14.0 years (14.0 ttl pk-yrs)    Types: Cigarettes    Start date: 07/17/1964    Quit date: 07/17/1978    Years since quitting: 44.9   Smokeless tobacco: Former  Building services engineer status: Never Used  Substance and Sexual Activity   Alcohol use: No   Drug use: No   Sexual activity: Yes  Other Topics Concern   Not on file  Social History Narrative   Not on file   Social Determinants of Health  Financial Resource Strain: Not on file  Food Insecurity: Not on file  Transportation Needs: Not on file  Physical Activity: Not on file  Stress: Not on file  Social Connections: Not on file   Family History  Problem Relation Age of Onset   Bladder Cancer Father    Parkinson's disease Brother    Colon cancer Neg Hx    Colon polyps Neg Hx    No Known Allergies Current Outpatient Medications  Medication Sig Dispense Refill   alfuzosin (UROXATRAL) 10 MG 24 hr tablet Take 10 mg by mouth daily with breakfast.     ezetimibe-simvastatin (VYTORIN) 10-20 MG per tablet Take 1 tablet by mouth at bedtime.     famotidine (PEPCID) 20 MG tablet Take 1 tablet (20 mg total) by mouth 2 (two) times daily. 30 tablet 3   pantoprazole (PROTONIX) 40 MG tablet Take 1 tablet (40 mg total) by mouth 2 (two) times daily. 60 tablet 11   sertraline (ZOLOFT) 100 MG tablet  Take one and 1/2 tablets daily. 135 tablet 3   telmisartan-hydrochlorothiazide (MICARDIS HCT) 80-12.5 MG tablet Take 1 tablet by mouth daily.     tirzepatide (MOUNJARO) 15 MG/0.5ML Pen Inject 15 mg into the skin once a week.     TOUJEO SOLOSTAR 300 UNIT/ML SOPN Inject 20 Units as directed at bedtime.     XIGDUO XR 04-999 MG TB24 Take 1 tablet by mouth daily.     No current facility-administered medications for this visit.   No results found.  Review of Systems:   A ROS was performed including pertinent positives and negatives as documented in the HPI.  Physical Exam :   Constitutional: NAD and appears stated age Neurological: Alert and oriented Psych: Appropriate affect and cooperative There were no vitals taken for this visit.   Comprehensive Musculoskeletal Exam:    Tenderness about the left shoulder over the lateral deltoid.  Active range of motion to 160 degrees forward flexion, 40 external rotation, and internal rotation L1 bilaterally.  Positive Neer and Hawkins.  Pain but no weakness noted with empty can testing.  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  No evidence of acute abnormality.   I personally reviewed and interpreted the radiographs.   Assessment:   67 y.o. male with ongoing pain in his left shoulder over the last 3 to 4 months.  Denies any specific injury.  He does have evidence of some degenerative changes in the glenohumeral joint although I do believe that his pain presents more consistent with rotator cuff tendinitis particularly given his frequency with swimming.  No significant strength deficits to suggest a tear.  After discussion of treatment options, we did ultimately decide to proceed with a cortisone injection today.  He is diabetic with last A1c was around 7.  I believe this would be best targeted in the subacromial space which was performed in clinic without any  complication.  Also discussed some gentle rotator cuff exercises he can perform at home.  Will plan to assess relief with injection and have him return to clinic as needed.  Plan :    -Left shoulder subacromial cortisone injection performed today -Return to clinic as needed    Procedure Note  Patient: Greg Oneill             Date of Birth: 04/20/1956           MRN: [id number]  Visit Date: 06/26/2023  Procedures: Visit Diagnoses:  1. Rotator cuff tendinitis, left     Large Joint Inj: L subacromial bursa on 06/26/2023 11:25 AM Indications: pain Details: 22 G 1.5 in needle, posterior approach Medications: 4 mL lidocaine 1 %; 2 mL triamcinolone acetonide 40 MG/ML Outcome: tolerated well, no immediate complications Procedure, treatment alternatives, risks and benefits explained, specific risks discussed. Consent was given by the patient. Immediately prior to procedure a time out was called to verify the correct patient, procedure, equipment, support staff and site/side marked as required. Patient was prepped and draped in the usual sterile fashion.      I personally saw and evaluated the patient, and participated in the management and treatment plan.  Hazle Nordmann, PA-C Orthopedics

## 2023-09-14 ENCOUNTER — Encounter: Payer: Self-pay | Admitting: Internal Medicine

## 2023-09-24 ENCOUNTER — Ambulatory Visit (HOSPITAL_BASED_OUTPATIENT_CLINIC_OR_DEPARTMENT_OTHER): Admitting: Student

## 2023-09-24 ENCOUNTER — Encounter (HOSPITAL_BASED_OUTPATIENT_CLINIC_OR_DEPARTMENT_OTHER): Payer: Self-pay | Admitting: Student

## 2023-09-24 VITALS — Ht 71.0 in | Wt 205.0 lb

## 2023-09-24 DIAGNOSIS — M7582 Other shoulder lesions, left shoulder: Secondary | ICD-10-CM

## 2023-09-24 MED ORDER — TRIAMCINOLONE ACETONIDE 40 MG/ML IJ SUSP
2.0000 mL | INTRAMUSCULAR | Status: AC | PRN
Start: 2023-09-24 — End: 2023-09-24
  Administered 2023-09-24: 2 mL via INTRA_ARTICULAR

## 2023-09-24 MED ORDER — LIDOCAINE HCL 1 % IJ SOLN
4.0000 mL | INTRAMUSCULAR | Status: AC | PRN
Start: 2023-09-24 — End: 2023-09-24
  Administered 2023-09-24: 4 mL

## 2023-09-24 NOTE — Progress Notes (Signed)
 Chief Complaint: Left shoulder pain     History of Present Illness:   09/24/23: Patient presents today for follow-up evaluation of his left shoulder.  He was seen in clinic 3 months ago and received a subacromial cortisone injection which he states overall gave him little relief.  Today he rates pain as moderate and describes it is generally aggravating.  Does have some pain with overhead range of motion, particularly in external rotation.  Denies taking any medication for pain.   RAZIEL KOENIGS is a 68 y.o. right-hand-dominant male here today for evaluation of left shoulder pain.  This is been ongoing for the last 3 to 4 months.  Denies any specific injury but does note that he had been under the car changing his oil when he first noticed the pain.  Pain is located mainly in the superior and lateral shoulder and does not radiate.  He rates pain at a 5/10 which is worsened with range of motion.  He does typically swim about 6 times per week for exercise.   Surgical History:   None  PMH/PSH/Family History/Social History/Meds/Allergies:    Past Medical History:  Diagnosis Date   Anxiety    Arthritis    bil knees   Depression    Diabetes mellitus without complication (HCC)    Hypercholesteremia    Hypertension    Past Surgical History:  Procedure Laterality Date   BIOPSY  10/27/2022   Procedure: BIOPSY;  Surgeon: Lanelle Bal, DO;  [facility];  Service: Endoscopy;;   COLONOSCOPY N/A 09/24/2017   Procedure: COLONOSCOPY;  Surgeon: West Bali, MD;  [facility]: [facility];  Service: Endoscopy;  Laterality: N/A;  10:30   COLONOSCOPY WITH PROPOFOL N/A 10/27/2022   Procedure: COLONOSCOPY WITH PROPOFOL;  Surgeon: Lanelle Bal, DO;  [facility];  Service: Endoscopy;  Laterality: N/A;  11:30am, asa 2   CYST EXCISION N/A 02/18/2019   Procedure: EXCISION CYST POSTERIOR NECK AND UPPER BACK;  Surgeon: Harriette Bouillon,  MD;  Location: [facility];  Service: General;  Laterality: N/A;   INSERTION OF MESH N/A 08/10/2014   Procedure: INSERTION OF MESH;  Surgeon: Dalia Heading, MD;  Location: [facility];  Service: General;  Laterality: N/A;   PILONIDAL CYST EXCISION  1977   Dr Leona Carry   POLYPECTOMY  09/24/2017   Procedure: POLYPECTOMY;  Surgeon: West Bali, MD;  [facility];  Service: Endoscopy;;  colon   POLYPECTOMY  10/27/2022   Procedure: POLYPECTOMY;  Surgeon: Lanelle Bal, DO;  [facility];  Service: Endoscopy;;   UMBILICAL HERNIA REPAIR N/A 08/10/2014   Procedure: UMBILICAL HERNIORRHAPHY ;  Surgeon: Dalia Heading, MD;  Location: [facility];  Service: General;  Laterality: N/A;   Social History   Socioeconomic History   Marital status: Married    Spouse name: Not on file   Number of children: Not on file   Years of education: Not on file   Highest education level: Not on file  Occupational History   Not on file  Tobacco Use   Smoking status: Former    Current packs/day: 0.00    Average packs/day: 1 pack/day for 14.0 years (14.0 ttl pk-yrs)    Types: Cigarettes    Start date: 07/17/1964  Quit date: 07/17/1978    Years since quitting: 45.2   Smokeless tobacco: Former  Building services engineer status: Never Used  Substance and Sexual Activity   Alcohol use: No   Drug use: No   Sexual activity: Yes  Other Topics Concern   Not on file  Social History Narrative   Not on file   Social Drivers of Health   Financial Resource Strain: Not on file  Food Insecurity: Not on file  Transportation Needs: Not on file  Physical Activity: Not on file  Stress: Not on file  Social Connections: Not on file   Family History  Problem Relation Age of Onset   Bladder Cancer Father    Parkinson's disease Brother    Colon cancer Neg Hx    Colon polyps Neg Hx    No Known Allergies Current Outpatient Medications  Medication Sig Dispense Refill   alfuzosin (UROXATRAL)  10 MG 24 hr tablet Take 10 mg by mouth daily with breakfast.     ezetimibe-simvastatin (VYTORIN) 10-20 MG per tablet Take 1 tablet by mouth at bedtime.     pantoprazole (PROTONIX) 40 MG tablet Take 1 tablet (40 mg total) by mouth 2 (two) times daily. 60 tablet 11   sertraline (ZOLOFT) 100 MG tablet Take one and 1/2 tablets daily. 135 tablet 3   telmisartan-hydrochlorothiazide (MICARDIS HCT) 80-12.5 MG tablet Take 1 tablet by mouth daily.     tirzepatide (MOUNJARO) 15 MG/0.5ML Pen Inject 15 mg into the skin once a week.     TOUJEO SOLOSTAR 300 UNIT/ML SOPN Inject 20 Units as directed at bedtime.     XIGDUO XR 04-999 MG TB24 Take 1 tablet by mouth daily.     No current facility-administered medications for this visit.   No results found.  Review of Systems:   A ROS was performed including pertinent positives and negatives as documented in the HPI.  Physical Exam :   Constitutional: NAD and appears stated age Neurological: Alert and oriented Psych: Appropriate affect and cooperative Height 5\' 11"  (1.803 m), weight 205 lb (93 kg).   Comprehensive Musculoskeletal Exam:    Left shoulder exam demonstrates tenderness over the lateral and posterior deltoid.  Active shoulder range of motion to 160 degrees forward flexion, 30 degrees external rotation, and internal rotation to L1.  Negative empty can and drop arm test.  Imaging:    Assessment:   68 y.o. male with continued pain in his left shoulder that has now been ongoing for approximately 6 months.  Pain continues to radiate to the lateral deltoid and is worsened with active external rotation.  Symptoms continues to suggest a rotator cuff tendinitis without any measurable strength deficits.  Patient would like to proceed with a cortisone injection today and I did discuss pros and cons of repeating and subacromial space versus performing glenohumeral injection as he does have some mild osteoarthritis.  Ultimately we did end up deciding to repeat  subacromial injection today which was performed without any complication.  Discussed that should this not again bring him relief, I would like to follow-up within a few weeks for further assessment either to consider GH injection or further investigation with MRI.  Patient in understanding and agreement of plan.  Plan :    -Left shoulder subacromial cortisone injection performed today -Return to clinic as needed or within 3 weeks should symptoms not improve    Procedure Note  Patient: Greg Oneill  Date of Birth: 11-Mar-1956           MRN: [id number]             Visit Date: 09/24/2023  Procedures: Visit Diagnoses:  1. Rotator cuff tendinitis, left      Large Joint Inj: L subacromial bursa on 09/24/2023 10:43 PM Indications: pain Details: 22 G 1.5 in needle, posterior approach Medications: 4 mL lidocaine 1 %; 2 mL triamcinolone acetonide 40 MG/ML Outcome: tolerated well, no immediate complications Procedure, treatment alternatives, risks and benefits explained, specific risks discussed. Consent was given by the patient. Immediately prior to procedure a time out was called to verify the correct patient, procedure, equipment, support staff and site/side marked as required. Patient was prepped and draped in the usual sterile fashion.      I personally saw and evaluated the patient, and participated in the management and treatment plan.  Hazle Nordmann, PA-C Orthopedics

## 2023-10-01 ENCOUNTER — Encounter (HOSPITAL_BASED_OUTPATIENT_CLINIC_OR_DEPARTMENT_OTHER): Payer: Self-pay

## 2023-11-07 ENCOUNTER — Ambulatory Visit (INDEPENDENT_AMBULATORY_CARE_PROVIDER_SITE_OTHER): Admitting: Internal Medicine

## 2023-11-07 ENCOUNTER — Encounter: Payer: Self-pay | Admitting: Internal Medicine

## 2023-11-07 VITALS — BP 137/78 | HR 81 | Temp 97.5°F | Ht 72.0 in | Wt 201.5 lb

## 2023-11-07 DIAGNOSIS — R0989 Other specified symptoms and signs involving the circulatory and respiratory systems: Secondary | ICD-10-CM

## 2023-11-07 DIAGNOSIS — K219 Gastro-esophageal reflux disease without esophagitis: Secondary | ICD-10-CM | POA: Diagnosis not present

## 2023-11-07 NOTE — Progress Notes (Signed)
 Referring Provider: Omie Bickers, MD Primary Care Physician:  Omie Bickers, MD Primary GI:  Dr. Mordechai April  Chief Complaint  Patient presents with   Follow-up    Patient here today for a follow up on Gerd. Patient is taking pantoprazole  mg bid , which he says is helping with the cough.     HPI:   Greg Oneill is a 68 y.o. male who presents to clinic today for follow-up visit for chronic GERD.  Patient states he was seen by the dentist who noted significant dental disease believed to be related to acid reflux.  Denies any classic heartburn or acid reflux.  No dysphagia odynophagia.  No epigastric or chest pain.    Does note chronic throat clearing numerous times daily.  Was seen by ENT 06/07/2023, underwent laryngoscopy which showed findings consistent with LPR, GERD.  Recommended famotidine  twice daily as well as trial of reflux Gourmet.  I started him on Pantoprazole  BID on previous visit and states he no longer suffers from chronic cough, Rarely has to clear his throat. Overall doing well.  Past Medical History:  Diagnosis Date   Anxiety    Arthritis    bil knees   Depression    Diabetes mellitus without complication (HCC)    Hypercholesteremia    Hypertension     Past Surgical History:  Procedure Laterality Date   BIOPSY  10/27/2022   Procedure: BIOPSY;  Surgeon: Vinetta Greening, DO;  [facility];  Service: Endoscopy;;   COLONOSCOPY N/A 09/24/2017   Procedure: COLONOSCOPY;  Surgeon: Alyce Jubilee, MD;  [facility];  Service: Endoscopy;  Laterality: N/A;  10:30   COLONOSCOPY WITH PROPOFOL  N/A 10/27/2022   Procedure: COLONOSCOPY WITH PROPOFOL ;  Surgeon: Vinetta Greening, DO;  [facility];  Service: Endoscopy;  Laterality: N/A;  11:30am, asa 2   CYST EXCISION N/A 02/18/2019   Procedure: EXCISION CYST POSTERIOR NECK AND UPPER BACK;  Surgeon: Sim Dryer, MD;  Location: [facility];  Service: General;  Laterality: N/A;    INSERTION OF MESH N/A 08/10/2014   Procedure: INSERTION OF MESH;  Surgeon: Beau Bound, MD;  [facility]: [facility];  Service: General;  Laterality: N/A;   PILONIDAL CYST EXCISION  1977   Dr Pieter Bride   POLYPECTOMY  09/24/2017   Procedure: POLYPECTOMY;  Surgeon: Alyce Jubilee, MD;  [facility];  Service: Endoscopy;;  colon   POLYPECTOMY  10/27/2022   Procedure: POLYPECTOMY;  Surgeon: Vinetta Greening, DO;  [facility];  Service: Endoscopy;;   UMBILICAL HERNIA REPAIR N/A 08/10/2014   Procedure: UMBILICAL HERNIORRHAPHY ;  Surgeon: Beau Bound, MD;  Location: [facility];  Service: General;  Laterality: N/A;    Current Outpatient Medications  Medication Sig Dispense Refill   alfuzosin (UROXATRAL) 10 MG 24 hr tablet Take 10 mg by mouth daily with breakfast.     ezetimibe-simvastatin (VYTORIN) 10-20 MG per tablet Take 1 tablet by mouth at bedtime.     pantoprazole  (PROTONIX ) 40 MG tablet Take 1 tablet (40 mg total) by mouth 2 (two) times daily. 60 tablet 11   sertraline  (ZOLOFT ) 100 MG tablet Take one and 1/2 tablets daily. 135 tablet 3   telmisartan -hydrochlorothiazide (MICARDIS  HCT) 80-12.5 MG tablet Take 1 tablet by mouth daily.     tirzepatide (MOUNJARO) 15 MG/0.5ML Pen Inject 15 mg into the skin once a week.     TOUJEO SOLOSTAR 300 UNIT/ML SOPN  Inject 16 Units as directed at bedtime.     XIGDUO XR 04-999 MG TB24 Take 1 tablet by mouth daily.     No current facility-administered medications for this visit.    Allergies as of 11/07/2023   (No Known Allergies)    Family History  Problem Relation Age of Onset   Bladder Cancer Father    Parkinson's disease Brother    Colon cancer Neg Hx    Colon polyps Neg Hx     Social History   Socioeconomic History   Marital status: Married    Spouse name: Not on file   Number of children: Not on file   Years of education: Not on file   Highest education level: Not on file  Occupational History   Not on file   Tobacco Use   Smoking status: Former    Current packs/day: 0.00    Average packs/day: 1 pack/day for 14.0 years (14.0 ttl pk-yrs)    Types: Cigarettes    Start date: 07/17/1964    Quit date: 07/17/1978    Years since quitting: 45.3   Smokeless tobacco: Former  Building services engineer status: Never Used  Substance and Sexual Activity   Alcohol use: No   Drug use: No   Sexual activity: Yes  Other Topics Concern   Not on file  Social History Narrative   Not on file   Social Drivers of Health   Financial Resource Strain: Not on file  Food Insecurity: Not on file  Transportation Needs: Not on file  Physical Activity: Not on file  Stress: Not on file  Social Connections: Not on file    Subjective: Review of Systems  Constitutional:  Negative for chills and fever.  HENT:  Negative for congestion and hearing loss.        Chronic throat clearing  Eyes:  Negative for blurred vision and double vision.  Respiratory:  Negative for cough and shortness of breath.   Cardiovascular:  Negative for chest pain and palpitations.  Gastrointestinal:  Negative for abdominal pain, blood in stool, constipation, diarrhea, heartburn, melena and vomiting.  Genitourinary:  Negative for dysuria and urgency.  Musculoskeletal:  Negative for joint pain and myalgias.  Skin:  Negative for itching and rash.  Neurological:  Negative for dizziness and headaches.  Psychiatric/Behavioral:  Negative for depression. The patient is not nervous/anxious.      Objective: BP 137/78 (BP Location: Left Arm, Patient Position: Sitting, Cuff Size: Normal)   Pulse 81   Temp (!) 97.5 F (36.4 C) (Temporal)   Ht 6' (1.829 m)   Wt 201 lb 8 oz (91.4 kg)   BMI 27.33 kg/m  Physical Exam Constitutional:      Appearance: Normal appearance.  HENT:     Head: Normocephalic and atraumatic.  Eyes:     Extraocular Movements: Extraocular movements intact.     Conjunctiva/sclera: Conjunctivae normal.  Cardiovascular:     Rate  and Rhythm: Normal rate and regular rhythm.  Pulmonary:     Effort: Pulmonary effort is normal.     Breath sounds: Normal breath sounds.  Abdominal:     General: Bowel sounds are normal.     Palpations: Abdomen is soft.  Musculoskeletal:        General: Normal range of motion.     Cervical back: Normal range of motion and neck supple.  Skin:    General: Skin is warm.  Neurological:     General: No focal deficit present.  Mental Status: He is alert and oriented to person, place, and time.  Psychiatric:        Mood and Affect: Mood normal.        Behavior: Behavior normal.      Assessment: *Chronic GERD *LPR *Chronic throat clearing  Plan: Patient doing much better today. Will decrease Pantoprazole  to once daily.   Follow-up in 6 months or sooner if needed. .  11/07/2023 3:05 PM   Disclaimer: This note was dictated with voice recognition software. Similar sounding words can inadvertently be transcribed and may not be corrected upon review.

## 2023-11-07 NOTE — Patient Instructions (Signed)
 I am happy to hear you are doing better. I think we can decrease your pantoprazole  to once daily.   Follow up in 6 months or sooner if needed.   It was very nice seeing you again today.   Dr. Mordechai April

## 2023-12-21 ENCOUNTER — Ambulatory Visit: Admitting: Urology

## 2023-12-21 ENCOUNTER — Encounter: Payer: Self-pay | Admitting: Urology

## 2023-12-21 VITALS — BP 116/72 | HR 86

## 2023-12-21 DIAGNOSIS — N5201 Erectile dysfunction due to arterial insufficiency: Secondary | ICD-10-CM

## 2023-12-21 LAB — URINALYSIS, ROUTINE W REFLEX MICROSCOPIC
Bilirubin, UA: NEGATIVE
Ketones, UA: NEGATIVE
Leukocytes,UA: NEGATIVE
Nitrite, UA: NEGATIVE
Protein,UA: NEGATIVE
RBC, UA: NEGATIVE
Specific Gravity, UA: 1.01 (ref 1.005–1.030)
Urobilinogen, Ur: 1 mg/dL (ref 0.2–1.0)
pH, UA: 6.5 (ref 5.0–7.5)

## 2023-12-21 MED ORDER — TADALAFIL 20 MG PO TABS
20.0000 mg | ORAL_TABLET | ORAL | 5 refills | Status: AC | PRN
Start: 2023-12-21 — End: ?

## 2023-12-21 NOTE — Patient Instructions (Signed)

## 2023-12-21 NOTE — Progress Notes (Signed)
 12/21/2023 9:09 AM   Greg Oneill 1955/11/29 [id number]  Referring provider: Omie Bickers, MD [address] [place],  [place] [address]  Erectile dysfunction   HPI: [name] is a 68yo here for evaluation of erectile dysfunction. For the past 3 years he noted an inability to get and maintain an erection. He has DMII for the past 9 years. A1c is 6.6. IPSS 21 QOl 3 on uroxatral 10mg  daily. He has urinary frequency and urgency while on Xigduo. He has previously tried cialis 10mg  and viagra without success.    PMH: Past Medical History:  Diagnosis Date   Anxiety    Arthritis    bil knees   Depression    Diabetes mellitus without complication (HCC)    Hypercholesteremia    Hypertension     Surgical History: Past Surgical History:  Procedure Laterality Date   BIOPSY  10/27/2022   Procedure: BIOPSY;  Surgeon: Vinetta Greening, DO;  [facility];  Service: Endoscopy;;   COLONOSCOPY N/A 09/24/2017   Procedure: COLONOSCOPY;  Surgeon: Alyce Jubilee, MD;  [facility];  Service: Endoscopy;  Laterality: N/A;  10:30   COLONOSCOPY WITH PROPOFOL  N/A 10/27/2022   Procedure: COLONOSCOPY WITH PROPOFOL ;  Surgeon: Vinetta Greening, DO;  [facility];  Service: Endoscopy;  Laterality: N/A;  11:30am, asa 2   CYST EXCISION N/A 02/18/2019   Procedure: EXCISION CYST POSTERIOR NECK AND UPPER BACK;  Surgeon: Sim Dryer, MD;  Location: [facility];  Service: General;  Laterality: N/A;   INSERTION OF MESH N/A 08/10/2014   Procedure: INSERTION OF MESH;  Surgeon: Beau Bound, MD;  [facility];  Service: General;  Laterality: N/A;   PILONIDAL CYST EXCISION  1977   Dr Pieter Bride   POLYPECTOMY  09/24/2017   Procedure: POLYPECTOMY;  Surgeon: Alyce Jubilee, MD;  [facility];  Service: Endoscopy;;  colon   POLYPECTOMY  10/27/2022   Procedure: POLYPECTOMY;  Surgeon: Vinetta Greening, DO;  [facility];  Service:  Endoscopy;;   UMBILICAL HERNIA REPAIR N/A 08/10/2014   Procedure: UMBILICAL HERNIORRHAPHY ;  Surgeon: Beau Bound, MD;  [facility]: [facility];  Service: General;  Laterality: N/A;    Home Medications:  Allergies as of 12/21/2023   No Known Allergies      Medication List        Accurate as of December 21, 2023  9:09 AM. If you have any questions, ask your nurse or doctor.          alfuzosin 10 MG 24 hr tablet Commonly known as: UROXATRAL Take 10 mg by mouth daily with breakfast.   ezetimibe-simvastatin 10-20 MG tablet Commonly known as: VYTORIN Take 1 tablet by mouth at bedtime.   Mounjaro 15 MG/0.5ML Pen Generic drug: tirzepatide Inject 15 mg into the skin once a week.   pantoprazole  40 MG tablet Commonly known as: PROTONIX  Take 1 tablet (40 mg total) by mouth 2 (two) times daily.   sertraline  100 MG tablet Commonly known as: ZOLOFT  Take one and 1/2 tablets daily.   telmisartan -hydrochlorothiazide 80-12.5 MG tablet Commonly known as: MICARDIS  HCT Take 1 tablet by mouth daily.   Toujeo SoloStar 300 UNIT/ML Solostar Pen Generic drug: insulin glargine (1 Unit Dial) Inject 16 Units as directed at bedtime.   Xigduo XR 04-999 MG Tb24 Generic drug: Dapagliflozin Pro-metFORMIN ER Take 1 tablet by mouth daily.        Allergies:  No Known Allergies  Family History: Family History  Problem Relation Age of Onset   Bladder Cancer Father    Parkinson's disease Brother    Colon cancer Neg Hx    Colon polyps Neg Hx     Social History:  reports that he quit smoking about 45 years ago. His smoking use included cigarettes. He started smoking about 59 years ago. He has a 14 pack-year smoking history. He has quit using smokeless tobacco. He reports that he does not drink alcohol and does not use drugs.  ROS: All other review of systems were reviewed and are negative except what is noted above in HPI  Physical Exam: BP 116/72   Pulse 86   Constitutional:  Alert and  oriented, No acute distress. HEENT: [place] AT, moist mucus membranes.  Trachea midline, no masses. Cardiovascular: No clubbing, cyanosis, or edema. Respiratory: Normal respiratory effort, no increased work of breathing. GI: Abdomen is soft, nontender, nondistended, no abdominal masses GU: No CVA tenderness.  Lymph: No cervical or inguinal lymphadenopathy. Skin: No rashes, bruises or suspicious lesions. Neurologic: Grossly intact, no focal deficits, moving all 4 extremities. Psychiatric: Normal mood and affect.  Laboratory Data: Lab Results  Component Value Date   WBC 6.7 04/11/2019   HGB 15.3 04/11/2019   HCT 44.1 04/11/2019   MCV 92.7 04/11/2019   PLT 203.0 04/11/2019    Lab Results  Component Value Date   CREATININE 1.07 02/14/2019    No results found for: "PSA"  No results found for: "TESTOSTERONE"  No results found for: "HGBA1C"  Urinalysis No results found for: "COLORURINE", "APPEARANCEUR", "LABSPEC", "PHURINE", "GLUCOSEU", "HGBUR", "BILIRUBINUR", "KETONESUR", "PROTEINUR", "UROBILINOGEN", "NITRITE", "LEUKOCYTESUR"  No results found for: "LABMICR", "WBCUA", "RBCUA", "LABEPIT", "MUCUS", "BACTERIA"  Pertinent Imaging:  No results found for this or any previous visit.  No results found for this or any previous visit.  No results found for this or any previous visit.  No results found for this or any previous visit.  No results found for this or any previous visit.  No results found for this or any previous visit.  No results found for this or any previous visit.  No results found for this or any previous visit.   Assessment & Plan:    1. Erectile dysfunction due to arterial insufficiency (Primary) We will trial Tadalafil 20mg   and if this fails to improve his erectile dysfunction we will likely proceed with trimix therapy.    No follow-ups on file.  [name], MD  [facility] [place]

## 2023-12-28 ENCOUNTER — Telehealth: Payer: Self-pay | Admitting: Urology

## 2023-12-28 DIAGNOSIS — N5201 Erectile dysfunction due to arterial insufficiency: Secondary | ICD-10-CM

## 2023-12-28 NOTE — Telephone Encounter (Signed)
 Says ED med is not working and would like an alternative. He said McKenzie talked about injections.

## 2024-01-01 ENCOUNTER — Encounter: Payer: Self-pay | Admitting: Urology

## 2024-01-01 MED ORDER — VARDENAFIL HCL 20 MG PO TABS
20.0000 mg | ORAL_TABLET | Freq: Every day | ORAL | 0 refills | Status: DC | PRN
Start: 2024-01-01 — End: 2024-01-04

## 2024-01-01 NOTE — Telephone Encounter (Signed)
 See pt message below regarding alternative rx for ED

## 2024-01-01 NOTE — Telephone Encounter (Signed)
 Patient is made aware via detailed  message Per Dr. Claretta Croft Please send levitra 20mg  prn #10 pills.

## 2024-01-04 ENCOUNTER — Ambulatory Visit (INDEPENDENT_AMBULATORY_CARE_PROVIDER_SITE_OTHER): Admitting: Urology

## 2024-01-04 ENCOUNTER — Encounter: Payer: Self-pay | Admitting: Urology

## 2024-01-04 VITALS — BP 111/63 | HR 76

## 2024-01-04 DIAGNOSIS — N5201 Erectile dysfunction due to arterial insufficiency: Secondary | ICD-10-CM | POA: Diagnosis not present

## 2024-01-04 MED ORDER — AMBULATORY NON FORMULARY MEDICATION: 0.2000 mL | 5 refills | Status: DC | PRN

## 2024-01-04 NOTE — Addendum Note (Signed)
 Addended byRoselee Cong on: 01/04/2024 03:22 PM   Modules accepted: Orders

## 2024-01-04 NOTE — Patient Instructions (Signed)

## 2024-01-04 NOTE — Progress Notes (Signed)
 01/04/2024 9:09 AM   Greg Oneill [birth year] [id number]  Referring provider: Omie Bickers, Greg Oneill [address] [place],  [place] [address]  Followup erectile dysfunction   HPI: Greg Oneill is a 67yo here for followup for erectile dysfunction. He tried levitra which failed to give him a firm erection. He gets a soft erection with tadalafil , sildenafil, and vardenafil. Hew denies any worsening LUTS   PMH: Past Medical History:  Diagnosis Date   Anxiety    Arthritis    bil knees   Depression    Diabetes mellitus without complication (HCC)    Hypercholesteremia    Hypertension     Surgical History: Past Surgical History:  Procedure Laterality Date   BIOPSY  10/27/2022   Procedure: BIOPSY;  Surgeon: Greg Greening, Greg Oneill;  [facility];  Service: Endoscopy;;   COLONOSCOPY N/A 09/24/2017   Procedure: COLONOSCOPY;  Surgeon: Greg Jubilee, Greg Oneill;  [facility];  Service: Endoscopy;  Laterality: N/A;  10:30   COLONOSCOPY WITH PROPOFOL  N/A 10/27/2022   Procedure: COLONOSCOPY WITH PROPOFOL ;  Surgeon: Greg Greening, Greg Oneill;  [facility];  Service: Endoscopy;  Laterality: N/A;  11:30am, asa 2   CYST EXCISION N/A 02/18/2019   Procedure: EXCISION CYST POSTERIOR NECK AND UPPER BACK;  Surgeon: Greg Dryer, Greg Oneill;  Location: [facility];  Service: General;  Laterality: N/A;   INSERTION OF MESH N/A 08/10/2014   Procedure: INSERTION OF MESH;  Surgeon: Greg Bound, Greg Oneill;  [facility];  Service: General;  Laterality: N/A;   PILONIDAL CYST EXCISION  1977   Dr Greg Oneill   POLYPECTOMY  09/24/2017   Procedure: POLYPECTOMY;  Surgeon: Greg Jubilee, Greg Oneill;  [facility];  Service: Endoscopy;;  colon   POLYPECTOMY  10/27/2022   Procedure: POLYPECTOMY;  Surgeon: Greg Greening, Greg Oneill;  [facility];  Service: Endoscopy;;   UMBILICAL HERNIA REPAIR N/A 08/10/2014   Procedure: UMBILICAL HERNIORRHAPHY ;  Surgeon: Greg Bound,  Greg Oneill;  [facility];  Service: General;  Laterality: N/A;    Home Medications:  Allergies as of 01/04/2024   No Known Allergies      Medication List        Accurate as of January 04, 2024  9:09 AM. If you have any questions, ask your nurse or doctor.          STOP taking these medications    gabapentin  300 MG capsule Commonly known as: Neurontin    metFORMIN 500 MG tablet Commonly known as: GLUCOPHAGE   vardenafil 20 MG tablet Commonly known as: LEVITRA       TAKE these medications    alfuzosin 10 MG 24 hr tablet Commonly known as: UROXATRAL Take 10 mg by mouth daily with breakfast.   ezetimibe-simvastatin 10-20 MG tablet Commonly known as: VYTORIN Take 1 tablet by mouth at bedtime.   Mounjaro 15 MG/0.5ML Pen Generic drug: tirzepatide Inject 15 mg into the skin once a week.   pantoprazole  40 MG tablet Commonly known as: PROTONIX  Take 1 tablet (40 mg total) by mouth 2 (two) times daily.   sertraline  100 MG tablet Commonly known as: ZOLOFT  Take one and 1/2 tablets daily.   telmisartan -hydrochlorothiazide 80-12.5 MG tablet Commonly known as: MICARDIS  HCT Take 1 tablet by mouth daily.   Toujeo SoloStar 300 UNIT/ML Solostar Pen Generic drug: insulin glargine (1 Unit Dial) Inject 16 Units as directed at bedtime.   Xigduo XR 04-999 MG  Tb24 Generic drug: Dapagliflozin Pro-metFORMIN ER Take 1 tablet by mouth daily.        Allergies: No Known Allergies  Family History: Family History  Problem Relation Age of Onset   Bladder Cancer Father    Parkinson's disease Brother    Colon cancer Neg Hx    Colon polyps Neg Hx     Social History:  reports that he quit smoking about 45 years ago. His smoking use included cigarettes. He started smoking about 59 years ago. He has a 14 pack-year smoking history. He has quit using smokeless tobacco. He reports that he does not drink alcohol and does not use drugs.  ROS: All other review of systems were reviewed  and are negative except what is noted above in HPI  Physical Exam: BP 111/63   Pulse 76   Constitutional:  Alert and oriented, No acute distress. HEENT: [place] AT, moist mucus membranes.  Trachea midline, no masses. Cardiovascular: No clubbing, cyanosis, or edema. Respiratory: Normal respiratory effort, no increased work of breathing. GI: Abdomen is soft, nontender, nondistended, no abdominal masses GU: No CVA tenderness.  Lymph: No cervical or inguinal lymphadenopathy. Skin: No rashes, bruises or suspicious lesions. Neurologic: Grossly intact, no focal deficits, moving all 4 extremities. Psychiatric: Normal mood and affect.  Laboratory Data: Lab Results  Component Value Date   WBC 6.7 04/11/2019   HGB 15.3 04/11/2019   HCT 44.1 04/11/2019   MCV 92.7 04/11/2019   PLT 203.0 04/11/2019    Lab Results  Component Value Date   CREATININE 1.07 02/14/2019    No results found for: PSA  No results found for: TESTOSTERONE  No results found for: HGBA1C  Urinalysis    Component Value Date/Time   APPEARANCEUR Clear 12/21/2023 0920   GLUCOSEU 3+ (A) 12/21/2023 0920   BILIRUBINUR Negative 12/21/2023 0920   PROTEINUR Negative 12/21/2023 0920   NITRITE Negative 12/21/2023 0920   LEUKOCYTESUR Negative 12/21/2023 0920    Lab Results  Component Value Date   LABMICR Comment 12/21/2023    Pertinent Imaging:  No results found for this or any previous visit.  No results found for this or any previous visit.  No results found for this or any previous visit.  No results found for this or any previous visit.  No results found for this or any previous visit.  No results found for this or any previous visit.  No results found for this or any previous visit.  No results found for this or any previous visit.   Assessment & Plan:    1. Erectile dysfunction due to arterial insufficiency (Primary) -We discussed VED, ICI, MUSE and IPP placement. After discussing the options  the patient elects for Trimix therapy.    No follow-ups on file.  Greg Nailer, Greg Oneill  [facility] [place]

## 2024-01-28 ENCOUNTER — Telehealth: Payer: Self-pay

## 2024-01-28 ENCOUNTER — Ambulatory Visit: Admitting: Urology

## 2024-01-28 NOTE — Telephone Encounter (Signed)
 Asking if he will need to bring medication to office visit?  If so, what and where does he need to pick it up at?  Please advise as soon as possible.

## 2024-01-30 ENCOUNTER — Ambulatory Visit (INDEPENDENT_AMBULATORY_CARE_PROVIDER_SITE_OTHER): Admitting: Urology

## 2024-01-30 VITALS — BP 148/75 | HR 78

## 2024-01-30 DIAGNOSIS — N5201 Erectile dysfunction due to arterial insufficiency: Secondary | ICD-10-CM | POA: Diagnosis not present

## 2024-01-30 NOTE — Progress Notes (Signed)
 01/30/2024 2:48 PM   Greg Oneill [birth year] [id number]  Referring provider: Shona Norleen PEDLAR, MD [address] [name],  [place] [address]  Erectile dysfunction   HPI: Greg Oneill is a 68yo here for followup for erectile dysfunction. He is here for trimix teaching   PMH: Past Medical History:  Diagnosis Date   Anxiety    Arthritis    bil knees   Depression    Diabetes mellitus without complication (HCC)    Hypercholesteremia    Hypertension     Surgical History: Past Surgical History:  Procedure Laterality Date   BIOPSY  10/27/2022   Procedure: BIOPSY;  Surgeon: Cindie Carlin POUR, DO;  [facility];  Service: Endoscopy;;   COLONOSCOPY N/A 09/24/2017   Procedure: COLONOSCOPY;  Surgeon: Harvey Margo CROME, MD;  [facility];  Service: Endoscopy;  Laterality: N/A;  10:30   COLONOSCOPY WITH PROPOFOL  N/A 10/27/2022   Procedure: COLONOSCOPY WITH PROPOFOL ;  Surgeon: Cindie Carlin POUR, DO;  [facility];  Service: Endoscopy;  Laterality: N/A;  11:30am, asa 2   CYST EXCISION N/A 02/18/2019   Procedure: EXCISION CYST POSTERIOR NECK AND UPPER BACK;  Surgeon: Vanderbilt Ned, MD;  Location: [facility];  Service: General;  Laterality: N/A;   INSERTION OF MESH N/A 08/10/2014   Procedure: INSERTION OF MESH;  Surgeon: Oneil DELENA Budge, MD;  Location: [facility];  Service: General;  Laterality: N/A;   PILONIDAL CYST EXCISION  1977   Dr Gina   POLYPECTOMY  09/24/2017   Procedure: POLYPECTOMY;  Surgeon: Harvey Margo CROME, MD;  [facility];  Service: Endoscopy;;  colon   POLYPECTOMY  10/27/2022   Procedure: POLYPECTOMY;  Surgeon: Cindie Carlin POUR, DO;  [facility];  Service: Endoscopy;;   UMBILICAL HERNIA REPAIR N/A 08/10/2014   Procedure: UMBILICAL HERNIORRHAPHY ;  Surgeon: Oneil DELENA Budge, MD;  Location: [facility];  Service: General;  Laterality: N/A;    Home Medications:  Allergies as of 01/30/2024   No Known Allergies       Medication List        Accurate as of January 30, 2024  2:48 PM. If you have any questions, ask your nurse or doctor.          alfuzosin 10 MG 24 hr tablet Commonly known as: UROXATRAL Take 10 mg by mouth daily with breakfast.   AMBULATORY NON FORMULARY MEDICATION 0.2 mLs by Intracavernosal route as needed. Medication Name: Trimix  PGE 30mcg Pap 30mg  Phent 1mg    ezetimibe-simvastatin 10-20 MG tablet Commonly known as: VYTORIN Take 1 tablet by mouth at bedtime.   meloxicam 15 MG tablet Commonly known as: MOBIC Take 15 mg by mouth daily.   Mounjaro 15 MG/0.5ML Pen Generic drug: tirzepatide Inject 15 mg into the skin once a week.   pantoprazole  40 MG tablet Commonly known as: PROTONIX  Take 1 tablet (40 mg total) by mouth 2 (two) times daily.   sertraline  100 MG tablet Commonly known as: ZOLOFT  Take one and 1/2 tablets daily.   telmisartan -hydrochlorothiazide 80-12.5 MG tablet Commonly known as: MICARDIS  HCT Take 1 tablet by mouth daily.   Toujeo SoloStar 300 UNIT/ML Solostar Pen Generic drug: insulin glargine (1 Unit Dial) Inject 16 Units as directed at bedtime.   Xigduo XR 04-999 MG Tb24 Generic drug: Dapagliflozin Pro-metFORMIN ER Take 1 tablet by mouth daily.        Allergies: No Known Allergies  Family History: Family History  Problem Relation Age of Onset   Bladder Cancer Father    Parkinson's disease Brother    Colon cancer Neg Hx    Colon polyps Neg Hx     Social History:  reports that he quit smoking about 45 years ago. His smoking use included cigarettes. He started smoking about 59 years ago. He has a 14 pack-year smoking history. He has quit using smokeless tobacco. He reports that he does not drink alcohol and does not use drugs.  ROS: All other review of systems were reviewed and are negative except what is noted above in HPI  Physical Exam: BP (!) 148/75   Pulse 78   Constitutional:  Alert and oriented, No acute  distress. HEENT: [place] AT, moist mucus membranes.  Trachea midline, no masses. Cardiovascular: No clubbing, cyanosis, or edema. Respiratory: Normal respiratory effort, no increased work of breathing. GI: Abdomen is soft, nontender, nondistended, no abdominal masses GU: No CVA tenderness.  Lymph: No cervical or inguinal lymphadenopathy. Skin: No rashes, bruises or suspicious lesions. Neurologic: Grossly intact, no focal deficits, moving all 4 extremities. Psychiatric: Normal mood and affect.  Laboratory Data: Lab Results  Component Value Date   WBC 6.7 04/11/2019   HGB 15.3 04/11/2019   HCT 44.1 04/11/2019   MCV 92.7 04/11/2019   PLT 203.0 04/11/2019    Lab Results  Component Value Date   CREATININE 1.07 02/14/2019    No results found for: PSA  No results found for: TESTOSTERONE  No results found for: HGBA1C  Urinalysis    Component Value Date/Time   APPEARANCEUR Clear 12/21/2023 0920   GLUCOSEU 3+ (A) 12/21/2023 0920   BILIRUBINUR Negative 12/21/2023 0920   PROTEINUR Negative 12/21/2023 0920   NITRITE Negative 12/21/2023 0920   LEUKOCYTESUR Negative 12/21/2023 0920    Lab Results  Component Value Date   LABMICR Comment 12/21/2023    Pertinent Imaging:  No results found for this or any previous visit.  No results found for this or any previous visit.  No results found for this or any previous visit.  No results found for this or any previous visit.  No results found for this or any previous visit.  No results found for this or any previous visit.  No results found for this or any previous visit.  No results found for this or any previous visit.  Trimix injection instruction:  Patient instructed to draw 0.7ml of trimix into the insulin syringe. I them instructed him to inject at the mid penile shaft at either the 3 or 9 o'clock position. I then injected the patient and he achieved a good erection in 20 minutes. Penile injection completed.    Assessment & Plan:    1. Erectile dysfunction due to arterial insufficiency (Primary) The patient was instructed on proper technique for trimix injection. He is instructed to alternate side and location of the injection. He was instructed in titrating the trimix dose. He is instructed to call the office for an erection lasting more than 4 hours  - Urinalysis, Routine w reflex microscopic   No follow-ups on file.  Belvie Clara, MD  [facility] [place]

## 2024-02-03 ENCOUNTER — Encounter: Payer: Self-pay | Admitting: Urology

## 2024-02-05 ENCOUNTER — Encounter: Payer: Self-pay | Admitting: Urology

## 2024-02-05 NOTE — Patient Instructions (Signed)

## 2024-02-22 ENCOUNTER — Encounter (HOSPITAL_BASED_OUTPATIENT_CLINIC_OR_DEPARTMENT_OTHER): Payer: Self-pay | Admitting: Physician Assistant

## 2024-02-22 ENCOUNTER — Ambulatory Visit (HOSPITAL_BASED_OUTPATIENT_CLINIC_OR_DEPARTMENT_OTHER): Admitting: Physician Assistant

## 2024-02-22 DIAGNOSIS — M25512 Pain in left shoulder: Secondary | ICD-10-CM

## 2024-02-22 MED ORDER — LIDOCAINE HCL 1 % IJ SOLN
4.0000 mL | INTRAMUSCULAR | Status: AC | PRN
Start: 2024-02-22 — End: 2024-02-22
  Administered 2024-02-22: 4 mL

## 2024-02-22 MED ORDER — TRIAMCINOLONE ACETONIDE 40 MG/ML IJ SUSP
80.0000 mg | INTRAMUSCULAR | Status: AC | PRN
  Administered 2024-02-22: 80 mg via INTRA_ARTICULAR

## 2024-02-22 MED ORDER — METHYLPREDNISOLONE ACETATE 40 MG/ML IJ SUSP
40.0000 mg | INTRAMUSCULAR | Status: AC | PRN
Start: 2024-02-22 — End: 2024-02-22
  Administered 2024-02-22: 40 mg via INTRA_ARTICULAR

## 2024-02-22 NOTE — Progress Notes (Signed)
 Who who schedule it is unusual  Office Visit Note   Patient: Greg Oneill           Date of Birth: 04-Jun-1956           MRN: [id number] Visit Date: 02/22/2024              Requested by: Shona Norleen PEDLAR, MD [address] [name],  [place] [address] PCP: Shona Norleen PEDLAR, MD   Assessment & Plan: Visit Diagnoses:  1. Left shoulder pain, unspecified chronicity     Plan: Left shoulder rotator cuff tendinitis.  Findings are more consistent with impingement and glenohumeral arthritis did well with a subacromial injection earlier this year we will go forward with this again.  If he does not have significant relief we will contact us  at this point I would recommend an MRI  Follow-Up Instructions: Return if symptoms worsen or fail to improve.   Orders:  Orders Placed This Encounter  Procedures   Large Joint Inj   No orders of the defined types were placed in this encounter.     Procedures: Large Joint Inj: L subacromial bursa on 02/22/2024 10:01 AM Indications: diagnostic evaluation and pain Details: 22 G 1.5 in needle, posterior approach  Arthrogram: No  Medications: 4 mL lidocaine  1 %; 40 mg methylPREDNISolone  acetate 40 MG/ML; 80 mg triamcinolone  acetonide 40 MG/ML Outcome: tolerated well, no immediate complications Procedure, treatment alternatives, risks and benefits explained, specific risks discussed. Consent was given by the patient. Immediately prior to procedure a time out was called to verify the correct patient, procedure, equipment, support staff and site/side marked as required. Patient was prepped and draped in the usual sterile fashion.       Clinical Data: No additional findings.   Subjective: Chief Complaint  Patient presents with   Left Shoulder - Pain    HPI patient is a pleasant 68 year old gentleman who comes in today with a history of left rotator cuff tendinitis.  He last had a subacromial injection in March and had good relief.  Comes in  requesting an injection today.  No new injury  Review of Systems  All other systems reviewed and are negative.    Objective: Vital Signs: There were no vitals taken for this visit.  Physical Exam Pulmonary:     Effort: Pulmonary effort is normal.     Breath sounds: Normal breath sounds.  Skin:    General: Skin is warm and dry.  Psychiatric:        Mood and Affect: Mood normal.        Behavior: Behavior normal.     Ortho Exam Examination of his left shoulder he has full range of motion no pain with external rotation he has good strength with resisted internal/external and abduction.  Grip strength is intact sensation is intact he does have a positive empty can test positive impingement findings Specialty Comments:  No specialty comments available.  Imaging: No results found.   PMFS History: Patient Active Problem List   Diagnosis Date Noted   Pain in left shoulder 02/22/2024   Other specified disorders of eustachian tube, bilateral 06/19/2023   Sensorineural hearing loss, bilateral 04/28/2023   Tinnitus, bilateral 04/28/2023   Morbid obesity due to excess calories (HCC) complicated by hbp,dm 05/24/2019   Upper airway cough syndrome 04/11/2019   DOE (dyspnea on exertion) 04/11/2019   Essential hypertension 04/11/2019   Special screening for malignant neoplasms, colon    Past Medical History:  Diagnosis  Date   Anxiety    Arthritis    bil knees   Depression    Diabetes mellitus without complication (HCC)    Hypercholesteremia    Hypertension     Family History  Problem Relation Age of Onset   Bladder Cancer Father    Parkinson's disease Brother    Colon cancer Neg Hx    Colon polyps Neg Hx     Past Surgical History:  Procedure Laterality Date   BIOPSY  10/27/2022   Procedure: BIOPSY;  Surgeon: Cindie Carlin POUR, DO;  [facility];  Service: Endoscopy;;   COLONOSCOPY N/A 09/24/2017   Procedure: COLONOSCOPY;  Surgeon: Harvey Margo CROME, MD;   [facility];  Service: Endoscopy;  Laterality: N/A;  10:30   COLONOSCOPY WITH PROPOFOL  N/A 10/27/2022   Procedure: COLONOSCOPY WITH PROPOFOL ;  Surgeon: Cindie Carlin POUR, DO;  [facility];  Service: Endoscopy;  Laterality: N/A;  11:30am, asa 2   CYST EXCISION N/A 02/18/2019   Procedure: EXCISION CYST POSTERIOR NECK AND UPPER BACK;  Surgeon: Vanderbilt Ned, MD;  Location: [facility];  Service: General;  Laterality: N/A;   INSERTION OF MESH N/A 08/10/2014   Procedure: INSERTION OF MESH;  Surgeon: Oneil DELENA Budge, MD;  Location: [facility];  Service: General;  Laterality: N/A;   PILONIDAL CYST EXCISION  1977   Dr Gina   POLYPECTOMY  09/24/2017   Procedure: POLYPECTOMY;  Surgeon: Harvey Margo CROME, MD;  [facility];  Service: Endoscopy;;  colon   POLYPECTOMY  10/27/2022   Procedure: POLYPECTOMY;  Surgeon: Cindie Carlin POUR, DO;  [facility];  Service: Endoscopy;;   UMBILICAL HERNIA REPAIR N/A 08/10/2014   Procedure: UMBILICAL HERNIORRHAPHY ;  Surgeon: Oneil DELENA Budge, MD;  Location: [facility];  Service: General;  Laterality: N/A;   Social History   Occupational History   Not on file  Tobacco Use   Smoking status: Former    Current packs/day: 0.00    Average packs/day: 1 pack/day for 14.0 years (14.0 ttl pk-yrs)    Types: Cigarettes    Start date: 07/17/1964    Quit date: 07/17/1978    Years since quitting: 45.6   Smokeless tobacco: Former  Building services engineer status: Never Used  Substance and Sexual Activity   Alcohol use: No   Drug use: No   Sexual activity: Yes

## 2024-03-07 ENCOUNTER — Encounter: Payer: Self-pay | Admitting: Radiology

## 2024-03-21 ENCOUNTER — Ambulatory Visit
Admission: EM | Admit: 2024-03-21 | Discharge: 2024-03-21 | Disposition: A | Discharge status: Home / Self Care | Attending: Family Medicine | Admitting: Family Medicine

## 2024-03-21 DIAGNOSIS — Z789 Other specified health status: Secondary | ICD-10-CM

## 2024-03-21 DIAGNOSIS — M25512 Pain in left shoulder: Secondary | ICD-10-CM | POA: Diagnosis not present

## 2024-03-21 HISTORY — DX: Other specified health status: Z78.9

## 2024-03-21 MED ORDER — PREDNISONE 20 MG PO TABS: 40.0000 mg | ORAL_TABLET | Freq: Every day | ORAL | 0 refills | Status: AC

## 2024-03-21 MED ORDER — TIZANIDINE HCL 4 MG PO CAPS: 4.0000 mg | ORAL_CAPSULE | Freq: Three times a day (TID) | ORAL | 0 refills | Status: AC | PRN

## 2024-03-21 NOTE — ED Triage Notes (Signed)
 Pt reports he has left shoulder pain for a few months. Picked up his cat and felt a sting in left arm. Painful increases with movement  Pt states he cannot remember when things happen

## 2024-03-21 NOTE — ED Provider Notes (Signed)
 [facility]    CSN: [id number] Arrival date & time: 03/21/24  1752      History   Chief Complaint No chief complaint on file.   HPI Greg Oneill is a 68 y.o. male.   Patient presenting today with acute on chronic left shoulder pain, stiffness, decreased range of motion.  States he has been having issues with this shoulder for several months and has received multiple shoulder joint injections from the orthopedist which tend to help for short periods of time.  He states several days ago he was wrestling with his cat to get the cat into the carrier and immediately felt pain in the shoulder and is now having stiffness and decreased range of motion.  Denies numbness, tingling, swelling, weakness, complete loss of range of motion or strength.  Trying over-the-counter remedies with minimal relief.    Past Medical History:  Diagnosis Date   Anxiety    Arthritis    bil knees   Depression    Diabetes mellitus without complication (HCC)    Hypercholesteremia    Hypertension    Poor historian 03/21/2024    Patient Active Problem List   Diagnosis Date Noted   Pain in left shoulder 02/22/2024   Other specified disorders of eustachian tube, bilateral 06/19/2023   Sensorineural hearing loss, bilateral 04/28/2023   Tinnitus, bilateral 04/28/2023   Morbid obesity due to excess calories (HCC) complicated by hbp,dm 05/24/2019   Upper airway cough syndrome 04/11/2019   DOE (dyspnea on exertion) 04/11/2019   Essential hypertension 04/11/2019   Special screening for malignant neoplasms, colon     Past Surgical History:  Procedure Laterality Date   BIOPSY  10/27/2022   Procedure: BIOPSY;  Surgeon: Cindie Carlin POUR, DO;  [facility];  Service: Endoscopy;;   COLONOSCOPY N/A 09/24/2017   Procedure: COLONOSCOPY;  Surgeon: Harvey Margo CROME, MD;  [facility];  Service: Endoscopy;  Laterality: N/A;  10:30   COLONOSCOPY WITH PROPOFOL  N/A 10/27/2022    Procedure: COLONOSCOPY WITH PROPOFOL ;  Surgeon: Cindie Carlin POUR, DO;  [facility];  Service: Endoscopy;  Laterality: N/A;  11:30am, asa 2   CYST EXCISION N/A 02/18/2019   Procedure: EXCISION CYST POSTERIOR NECK AND UPPER BACK;  Surgeon: Vanderbilt Ned, MD;  Location: [facility];  Service: General;  Laterality: N/A;   INSERTION OF MESH N/A 08/10/2014   Procedure: INSERTION OF MESH;  Surgeon: Oneil DELENA Budge, MD;  Location[facility];  Service: General;  Laterality: N/A;   PILONIDAL CYST EXCISION  1977   Dr Gina   POLYPECTOMY  09/24/2017   Procedure: POLYPECTOMY;  Surgeon: Harvey Margo CROME, MD;  [facility];  Service: Endoscopy;;  colon   POLYPECTOMY  10/27/2022   Procedure: POLYPECTOMY;  Surgeon: Cindie Carlin POUR, DO;  [facility];  Service: Endoscopy;;   UMBILICAL HERNIA REPAIR N/A 08/10/2014   Procedure: UMBILICAL HERNIORRHAPHY ;  Surgeon: Oneil DELENA Budge, MD;  Location: [facility];  Service: General;  Laterality: N/A;       Home Medications    Prior to Admission medications   Medication Sig Start Date End Date Taking? Authorizing Provider  predniSONE  (DELTASONE ) 20 MG tablet Take 2 tablets (40 mg total) by mouth daily with breakfast. 03/21/24  Yes Stuart Vernell Norris, PA-C  tiZANidine  (ZANAFLEX ) 4 MG capsule Take 1 capsule (4 mg total) by mouth 3 (three) times daily as needed for muscle spasms. Do not drink alcohol or drive  while taking this medication.  May cause drowsiness. 03/21/24  Yes Stuart Vernell Norris, PA-C  alfuzosin (UROXATRAL) 10 MG 24 hr tablet Take 10 mg by mouth daily with breakfast.    [provider]  AMBULATORY NON FORMULARY MEDICATION 0.2 mLs by Intracavernosal route as needed. Medication Name: Trimix  PGE 30mcg Pap 30mg  Phent 1mg  01/04/24   McKenzie, Belvie CROME, MD  ezetimibe-simvastatin (VYTORIN) 10-20 MG per tablet Take 1 tablet by mouth at bedtime.    [provider]  meloxicam (MOBIC) 15 MG tablet  Take 15 mg by mouth daily. 01/28/24   [provider]  pantoprazole  (PROTONIX ) 40 MG tablet Take 1 tablet (40 mg total) by mouth 2 (two) times daily. 06/21/23 06/20/24  Cindie Carlin POUR, DO  sertraline  (ZOLOFT ) 100 MG tablet Take one and 1/2 tablets daily. 05/23/23   Mozingo, Regina Nattalie, NP  telmisartan -hydrochlorothiazide (MICARDIS  HCT) 80-12.5 MG tablet Take 1 tablet by mouth daily. 05/16/21   [provider]  tirzepatide CLOYDE) 15 MG/0.5ML Pen Inject 15 mg into the skin once a week.    [provider]  TOUJEO SOLOSTAR 300 UNIT/ML SOPN Inject 16 Units as directed at bedtime. 07/03/17   [provider]  XIGDUO XR 04-999 MG TB24 Take 1 tablet by mouth daily. 04/19/21   [provider]    Family History Family History  Problem Relation Age of Onset   Bladder Cancer Father    Parkinson's disease Brother    Colon cancer Neg Hx    Colon polyps Neg Hx     Social History Social History   Tobacco Use   Smoking status: Former    Current packs/day: 0.00    Average packs/day: 1 pack/day for 14.0 years (14.0 ttl pk-yrs)    Types: Cigarettes    Start date: 07/17/1964    Quit date: 07/17/1978    Years since quitting: 45.7   Smokeless tobacco: Former  Building services engineer status: Never Used  Substance Use Topics   Alcohol use: No   Drug use: No     Allergies   Patient has no known allergies.   Review of Systems Review of Systems Per HPI  Physical Exam Triage Vital Signs ED Triage Vitals  Encounter Vitals Group     BP 03/21/24 1829 119/76     Girls Systolic BP Percentile --      Girls Diastolic BP Percentile --      Boys Systolic BP Percentile --      Boys Diastolic BP Percentile --      Pulse Rate 03/21/24 1829 91     Resp 03/21/24 1829 16     Temp 03/21/24 1829 98.1 F (36.7 C)     Temp Source 03/21/24 1829 Oral     SpO2 03/21/24 1829 98 %     Weight --      Height --      Head Circumference --      Peak Flow --       Pain Score 03/21/24 1831 5     Pain Loc --      Pain Education --      Exclude from Growth Chart --    No data found.  Updated Vital Signs BP 119/76 (BP Location: Right Arm)   Pulse 91   Temp 98.1 F (36.7 C) (Oral)   Resp 16   SpO2 98%   Visual Acuity Right Eye Distance:   Left Eye Distance:   Bilateral Distance:  Right Eye Near:   Left Eye Near:    Bilateral Near:     Physical Exam Vitals and nursing note reviewed.  Constitutional:      Appearance: Normal appearance.  HENT:     Head: Atraumatic.  Eyes:     Extraocular Movements: Extraocular movements intact.     Conjunctiva/sclera: Conjunctivae normal.  Cardiovascular:     Rate and Rhythm: Normal rate.  Pulmonary:     Effort: Pulmonary effort is normal.  Musculoskeletal:        General: Tenderness present. No swelling or deformity. Normal range of motion.     Cervical back: Normal range of motion and neck supple.     Comments: Tenderness to palpation to the insertion of the deltoid muscle and left shoulder joint lines.  Able to extend to almost 90 degrees laterally.  Grip strength full and equal bilateral hands  Skin:    General: Skin is warm and dry.     Findings: No bruising or erythema.  Neurological:     General: No focal deficit present.     Mental Status: He is oriented to person, place, and time.     Comments: Left upper extremity neurovascularly intact  Psychiatric:        Mood and Affect: Mood normal.        Thought Content: Thought content normal.        Judgment: Judgment normal.      UC Treatments / Results  Labs (all labs ordered are listed, but only abnormal results are displayed) Labs Reviewed - No data to display  EKG   Radiology No results found.  Procedures Procedures (including critical care time)  Medications Ordered in UC Medications - No data to display  Initial Impression / Assessment and Plan / UC Course  I have reviewed the triage vital signs and the nursing  notes.  Pertinent labs & imaging results that were available during my care of the patient were reviewed by me and considered in my medical decision making (see chart for details).     Acute on chronic left shoulder strain.  Will treat with short course of prednisone  with close monitoring of home blood sugars, Zanaflex , heat,/, stretches, rest and close orthopedic follow-up for further evaluation if not improving.  Final Clinical Impressions(s) / UC Diagnoses   Final diagnoses:  Acute pain of left shoulder     Discharge Instructions      I have sent over some prednisone  and a muscle relaxer to help with your shoulder pain until you can get in with the orthopedist for further evaluation.  Make sure to monitor your blood sugars closely while on the prednisone  as this will significantly elevate your blood sugars.  You may also do heat, massage, gentle stretches and range of motion exercises.    ED Prescriptions     Medication Sig Dispense Auth. Provider   predniSONE  (DELTASONE ) 20 MG tablet Take 2 tablets (40 mg total) by mouth daily with breakfast. 6 tablet Stuart Vernell Norris, PA-C   tiZANidine  (ZANAFLEX ) 4 MG capsule Take 1 capsule (4 mg total) by mouth 3 (three) times daily as needed for muscle spasms. Do not drink alcohol or drive while taking this medication.  May cause drowsiness. 15 capsule Stuart Vernell Norris, [place]      PDMP not reviewed this encounter.   Stuart Vernell Norris, [place] 03/21/24 1927

## 2024-03-21 NOTE — Discharge Instructions (Signed)
 I have sent over some prednisone  and a muscle relaxer to help with your shoulder pain until you can get in with the orthopedist for further evaluation.  Make sure to monitor your blood sugars closely while on the prednisone  as this will significantly elevate your blood sugars.  You may also do heat, massage, gentle stretches and range of motion exercises.

## 2024-03-31 ENCOUNTER — Encounter: Payer: Self-pay | Admitting: Internal Medicine

## 2024-04-02 ENCOUNTER — Ambulatory Visit: Payer: Self-pay

## 2024-04-02 NOTE — Telephone Encounter (Signed)
 FYI Only or Action Required?: FYI only for provider.  Patient was last seen in primary care on 04/16/2023 by Moishe Chiquita HERO, NP.  Called Nurse Triage reporting Shoulder Pain.  Symptoms began a week ago.  Interventions attempted: Nothing.  Symptoms are: gradually worsening.  Triage Disposition: See PCP When Office is Open (Within 3 Days)  Patient/caregiver understands and will follow disposition?: Yes   Copied from CRM #[id number]. Topic: Clinical - Red Word Triage >> Apr 02, 2024 11:01 AM Wess RAMAN wrote: Red Word that prompted transfer to Nurse Triage: Left Shoulder pain. Patient described the pain as painful and he is unable to raise it above his head. He is requesting a another steroid shot from Dr. Alvia, Selinda at [facility] location in [place] Reason for Disposition  [1] MODERATE pain (e.g., interferes with normal activities) AND [2] present > 3 days  Answer Assessment - Initial Assessment Questions Patient attempted to reach orthopedic office and called PCP in error.  Correct phone number provided, [phone]   1. ONSET: When did the pain start?     Ongoing pain, flare up about ten days ago 2. LOCATION: Where is the pain located?     Left shoulder pain 3. PAIN: How bad is the pain? (Scale 1-10; or mild, moderate, severe)     Unable to lift elbow above shoulder 4. WORK OR EXERCISE: Has there been any recent work or exercise that involved this part of the body?     Swims 6 days a week and recently messed it up putting a cat in a carrier to go to vet 5. CAUSE: What do you think is causing the shoulder pain?     See above 6. OTHER SYMPTOMS: Do you have any other symptoms? (e.g., neck pain, swelling, rash, fever, numbness, weakness)     denies 7. PREGNANCY: Is there any chance you are pregnant? When was your last menstrual period?     N/a  Protocols used: Shoulder Pain-A-AH

## 2024-04-30 ENCOUNTER — Encounter: Payer: Self-pay | Admitting: Adult Health

## 2024-04-30 ENCOUNTER — Ambulatory Visit (INDEPENDENT_AMBULATORY_CARE_PROVIDER_SITE_OTHER): Admitting: Adult Health

## 2024-04-30 DIAGNOSIS — F411 Generalized anxiety disorder: Secondary | ICD-10-CM

## 2024-04-30 DIAGNOSIS — F331 Major depressive disorder, recurrent, moderate: Secondary | ICD-10-CM

## 2024-04-30 DIAGNOSIS — F429 Obsessive-compulsive disorder, unspecified: Secondary | ICD-10-CM

## 2024-04-30 MED ORDER — SERTRALINE HCL 100 MG PO TABS: ORAL_TABLET | ORAL | 3 refills | Status: AC

## 2024-04-30 NOTE — Progress Notes (Signed)
 Greg Oneill [id number] Apr 30, 1956 68 y.o.  Subjective:   Patient ID:  Greg Oneill is a 68 y.o. (DOB 14-Jul-1956) male.  Chief Complaint: No chief complaint on file.   HPI Greg Oneill presents to the office today for follow-up of GAD, MDD, and OCD.  Describes mood today as ok. Pleasant. Denies tearfulness. Mood symptoms - denies depression, anxiety and irritability. Reports stable interest and motivation. Denies panic attacks. Denies worry, rumination, and over thinking. Reporting some mild compulsions - I need to work on that. Reports mood is stable. Stating I feel like I'm doing good overall. Feels like Zoloft  150mg  continues to work well for him - for the most part - but would like to increase the dose to 200mg  daily. Taking medications as prescribed.  Energy levels stable. Active, has a regular exercise routine. Swimming 6 days a week. Walking. Golfing 7 days a week.  Enjoys some usual interests and activities. Married. Lives with wife. Has 3 grown sons. 1 granddaughter. Spending time with family. Appetite adequate. Weight loss - 188 pounds - Mounjaro.  Sleeps well most nights. Averages 7 to 8 hours. Reports some daytime napping.   Focus and concentration stable. Completing tasks. Managing aspects of household. Retired from post office in 2018-32 years. Denies SI or HI.  Denies AH or VH. Denies self harm. Denies substance use.  Previous medication trials: Lexapro, Wellbutrin, Prozac, Paxil   Flowsheet Row UC from 03/21/2024 in [facility] Ad[facility] (Discharged) from 10/27/2022 in [facility] UC from 01/14/2022 in [facility] RISK CATEGORY No Risk No Risk No Risk     Review of Systems:  Review of Systems  Musculoskeletal:  Negative for gait problem.  Neurological:  Negative for tremors.  Psychiatric/Behavioral:         Please refer to HPI    Medications: I have reviewed the patient's  current medications.  Current Outpatient Medications  Medication Sig Dispense Refill   alfuzosin (UROXATRAL) 10 MG 24 hr tablet Take 10 mg by mouth daily with breakfast.     AMBULATORY NON FORMULARY MEDICATION 0.2 mLs by Intracavernosal route as needed. Medication Name: Trimix  PGE 30mcg Pap 30mg  Phent 1mg  5 mL 5   ezetimibe-simvastatin (VYTORIN) 10-20 MG per tablet Take 1 tablet by mouth at bedtime.     meloxicam (MOBIC) 15 MG tablet Take 15 mg by mouth daily.     pantoprazole  (PROTONIX ) 40 MG tablet Take 1 tablet (40 mg total) by mouth 2 (two) times daily. 60 tablet 11   predniSONE  (DELTASONE ) 20 MG tablet Take 2 tablets (40 mg total) by mouth daily with breakfast. 6 tablet 0   sertraline  (ZOLOFT ) 100 MG tablet Take one and 1/2 tablets daily. 135 tablet 3   telmisartan -hydrochlorothiazide (MICARDIS  HCT) 80-12.5 MG tablet Take 1 tablet by mouth daily.     tirzepatide (MOUNJARO) 15 MG/0.5ML Pen Inject 15 mg into the skin once a week.     tiZANidine  (ZANAFLEX ) 4 MG capsule Take 1 capsule (4 mg total) by mouth 3 (three) times daily as needed for muscle spasms. Do not drink alcohol or drive while taking this medication.  May cause drowsiness. 15 capsule 0   TOUJEO SOLOSTAR 300 UNIT/ML SOPN Inject 16 Units as directed at bedtime.     XIGDUO XR 04-999 MG TB24 Take 1 tablet by mouth daily.     No current facility-administered medications for this visit.    Medication Side Effects: None  Allergies: No Known Allergies  Past Medical History:  Diagnosis Date   Anxiety    Arthritis    bil knees   Depression    Diabetes mellitus without complication (HCC)    Hypercholesteremia    Hypertension    Poor historian 03/21/2024    Past Medical History, Surgical history, Social history, and Family history were reviewed and updated as appropriate.   Please see review of systems for further details on the patient's review from today.   Objective:   Physical Exam:  There were no vitals taken  for this visit.  Physical Exam Constitutional:      General: He is not in acute distress. Musculoskeletal:        General: No deformity.  Neurological:     Mental Status: He is alert and oriented to person, place, and time.     Coordination: Coordination normal.  Psychiatric:        Attention and Perception: Attention and perception normal. He does not perceive auditory or visual hallucinations.        Mood and Affect: Mood normal. Mood is not anxious or depressed. Affect is not labile, blunt, angry or inappropriate.        Speech: Speech normal.        Behavior: Behavior normal.        Thought Content: Thought content normal. Thought content is not paranoid or delusional. Thought content does not include homicidal or suicidal ideation. Thought content does not include homicidal or suicidal plan.        Cognition and Memory: Cognition and memory normal.        Judgment: Judgment normal.     Comments: Insight intact     Lab Review:     Component Value Date/Time   NA 134 (L) 02/14/2019 1013   K 4.4 02/14/2019 1013   CL 100 02/14/2019 1013   CO2 24 02/14/2019 1013   GLUCOSE 151 (H) 02/14/2019 1013   BUN 12 02/14/2019 1013   CREATININE 1.07 02/14/2019 1013   CALCIUM 8.8 (L) 02/14/2019 1013   PROT 6.5 02/14/2019 1013   ALBUMIN 4.0 02/14/2019 1013   AST 37 02/14/2019 1013   ALT 29 02/14/2019 1013   ALKPHOS 67 02/14/2019 1013   BILITOT 1.8 (H) 02/14/2019 1013   GFRNONAA >60 02/14/2019 1013   GFRAA >60 02/14/2019 1013       Component Value Date/Time   WBC 6.7 04/11/2019 1507   RBC 4.76 04/11/2019 1507   HGB 15.3 04/11/2019 1507   HCT 44.1 04/11/2019 1507   PLT 203.0 04/11/2019 1507   MCV 92.7 04/11/2019 1507   MCH 31.1 02/14/2019 1013   MCHC 34.6 04/11/2019 1507   RDW 14.1 04/11/2019 1507   LYMPHSABS 1.5 04/11/2019 1507   MONOABS 0.5 04/11/2019 1507   EOSABS 0.1 04/11/2019 1507   BASOSABS 0.0 04/11/2019 1507    No results found for: POCLITH, LITHIUM   No  results found for: PHENYTOIN, PHENOBARB, VALPROATE, CBMZ   .res Assessment: Plan:    Plan:  PDMP reviewed  1. Increase Zoloft  150mg  to 200mg  daily  RTC 1 year  25 minutes spent dedicated to the care of this patient on the date of this encounter to include pre-visit review of records, ordering of medication, post visit documentation, and face-to-face time with the patient discussing MDD, GAD and OCD. Discussed continuing current medication regimen.  Patient advised to contact office with any questions, adverse effects, or acute worsening in signs and symptoms.  There are  no diagnoses linked to this encounter.   Please see After Visit Summary for patient specific instructions.  Future Appointments  Date Time Provider Department Center  06/17/2024  1:00 PM Karis Clunes, MD [facility] None  06/17/2024  1:30 PM Leroux-Martinez, Rosaline Jansky, AUD [facility] None  08/01/2024  9:20 AM McKenzie, Belvie CROME, MD [facility] None    No orders of the defined types were placed in this encounter.   -------------------------------

## 2024-05-19 ENCOUNTER — Encounter: Payer: Self-pay | Admitting: Radiology

## 2024-06-17 ENCOUNTER — Ambulatory Visit (INDEPENDENT_AMBULATORY_CARE_PROVIDER_SITE_OTHER): Payer: 59 | Admitting: Audiology

## 2024-06-17 ENCOUNTER — Ambulatory Visit (INDEPENDENT_AMBULATORY_CARE_PROVIDER_SITE_OTHER): Payer: 59 | Admitting: Otolaryngology

## 2024-07-31 ENCOUNTER — Other Ambulatory Visit (INDEPENDENT_AMBULATORY_CARE_PROVIDER_SITE_OTHER): Payer: Self-pay | Admitting: Otolaryngology

## 2024-07-31 ENCOUNTER — Other Ambulatory Visit: Payer: Self-pay | Admitting: Internal Medicine

## 2024-07-31 DIAGNOSIS — H903 Sensorineural hearing loss, bilateral: Secondary | ICD-10-CM

## 2024-07-31 NOTE — Telephone Encounter (Signed)
 Received this script which states patient is taking pantoprazole  40 mg bid, and the last office visit note from 11/07/2023, per Dr. Cindie Plan: Patient doing much better today. Will decrease Pantoprazole  to once daily.   I spoke with the patient and he states he never decreased the medication to once per day, but thinks he can as he is not having any current issues with Thalia.   Are you ok with me sending in a new script for pantoprazole  40 mg once per day per the last office visit. To [facility]?

## 2024-08-01 ENCOUNTER — Encounter: Payer: Self-pay | Admitting: Urology

## 2024-08-01 ENCOUNTER — Ambulatory Visit: Admitting: Urology

## 2024-08-01 VITALS — BP 114/66 | HR 77

## 2024-08-01 DIAGNOSIS — R350 Frequency of micturition: Secondary | ICD-10-CM | POA: Diagnosis not present

## 2024-08-01 DIAGNOSIS — N5201 Erectile dysfunction due to arterial insufficiency: Secondary | ICD-10-CM | POA: Diagnosis not present

## 2024-08-01 DIAGNOSIS — N401 Enlarged prostate with lower urinary tract symptoms: Secondary | ICD-10-CM | POA: Diagnosis not present

## 2024-08-01 MED ORDER — AMBULATORY NON FORMULARY MEDICATION: 0.2000 mL | 5 refills | Status: AC | PRN

## 2024-08-01 MED ORDER — ALFUZOSIN HCL ER 10 MG PO TB24: 10.0000 mg | ORAL_TABLET | Freq: Every day | ORAL | 11 refills | Status: AC

## 2024-08-01 NOTE — Progress Notes (Unsigned)
 "  08/01/2024 9:38 AM   Greg Oneill 13-Feb-1956 [id number]  Referring provider: Shona Norleen PEDLAR, MD [address] [name],  [place] [address]  Followup erectile dysfunction   HPI: [name] is a 69yo here for followup for erectile dysfunction and BPH with urinary frequency. He uses trimix 0.2ml prn with mixed results. IPSS 7 QOL 2 on uroxatral  10mg  at bedtime. Urinary frequency every 2-3 hours. Urine stream strong. No straining to urinate. No other complaints today   PMH: Past Medical History:  Diagnosis Date   Anxiety    Arthritis    bil knees   Depression    Diabetes mellitus without complication (HCC)    Hypercholesteremia    Hypertension    Poor historian 03/21/2024    Surgical History: Past Surgical History:  Procedure Laterality Date   BIOPSY  10/27/2022   Procedure: BIOPSY;  Surgeon: Cindie Carlin POUR, DO;  [facility];  Service: Endoscopy;;   COLONOSCOPY N/A 09/24/2017   Procedure: COLONOSCOPY;  Surgeon: Harvey Margo CROME, MD;  [facility]: [facility];  Service: Endoscopy;  Laterality: N/A;  10:30   COLONOSCOPY WITH PROPOFOL  N/A 10/27/2022   Procedure: COLONOSCOPY WITH PROPOFOL ;  Surgeon: Cindie Carlin POUR, DO;  [facility]: [facility];  Service: Endoscopy;  Laterality: N/A;  11:30am, asa 2   CYST EXCISION N/A 02/18/2019   Procedure: EXCISION CYST POSTERIOR NECK AND UPPER BACK;  Surgeon: Vanderbilt Ned, MD;  Location: [facility];  Service: General;  Laterality: N/A;   INSERTION OF MESH N/A 08/10/2014   Procedure: INSERTION OF MESH;  Surgeon: Oneil DELENA Budge, MD;  Location: [facility];  Service: General;  Laterality: N/A;   PILONIDAL CYST EXCISION  1977   Dr Gina   POLYPECTOMY  09/24/2017   Procedure: POLYPECTOMY;  Surgeon: Harvey Margo CROME, MD;  [facility]: [facility];  Service: Endoscopy;;  colon   POLYPECTOMY  10/27/2022   Procedure: POLYPECTOMY;  Surgeon: Cindie Carlin POUR, DO;  [facility]: [facility];  Service: Endoscopy;;    UMBILICAL HERNIA REPAIR N/A 08/10/2014   Procedure: UMBILICAL HERNIORRHAPHY ;  Surgeon: Oneil DELENA Budge, MD;  Location: [facility];  Service: General;  Laterality: N/A;    Home Medications:  Allergies as of 08/01/2024   No Known Allergies      Medication List        Accurate as of August 01, 2024  9:38 AM. If you have any questions, ask your nurse or doctor.          alfuzosin  10 MG 24 hr tablet Commonly known as: UROXATRAL  Take 10 mg by mouth daily with breakfast.   AMBULATORY NON FORMULARY MEDICATION 0.2 mLs by Intracavernosal route as needed. Medication Name: Trimix  PGE 30mcg Pap 30mg  Phent 1mg    ezetimibe-simvastatin 10-20 MG tablet Commonly known as: VYTORIN Take 1 tablet by mouth at bedtime.   meloxicam 15 MG tablet Commonly known as: MOBIC Take 15 mg by mouth daily.   Mounjaro 15 MG/0.5ML Pen Generic drug: tirzepatide Inject 15 mg into the skin once a week.   pantoprazole  40 MG tablet Commonly known as: PROTONIX  Take 1 tablet (40 mg total) by mouth daily.   predniSONE  20 MG tablet Commonly known as: DELTASONE  Take 2 tablets (40 mg total) by mouth daily with breakfast.   sertraline  100 MG tablet Commonly known as: ZOLOFT  Take two tablets daily.   telmisartan -hydrochlorothiazide 80-12.5 MG tablet Commonly known as: MICARDIS  HCT Take 1 tablet by mouth daily.  tiZANidine  4 MG capsule Commonly known as: Zanaflex  Take 1 capsule (4 mg total) by mouth 3 (three) times daily as needed for muscle spasms. Do not drink alcohol or drive while taking this medication.  May cause drowsiness.   Toujeo SoloStar 300 UNIT/ML Solostar Pen Generic drug: insulin glargine (1 Unit Dial) Inject 16 Units as directed at bedtime.   Xigduo XR 04-999 MG Tb24 Generic drug: Dapagliflozin Pro-metFORMIN ER Take 1 tablet by mouth daily.        Allergies: Allergies[1]  Family History: Family History  Problem Relation Age of Onset   Bladder Cancer Father    Parkinson's  disease Brother    Colon cancer Neg Hx    Colon polyps Neg Hx     Social History:  reports that he quit smoking about 46 years ago. His smoking use included cigarettes. He started smoking about 60 years ago. He has a 14 pack-year smoking history. He has quit using smokeless tobacco. He reports that he does not drink alcohol and does not use drugs.  ROS: All other review of systems were reviewed and are negative except what is noted above in HPI  Physical Exam: BP 114/66   Pulse 77   Constitutional:  Alert and oriented, No acute distress. HEENT: [place] AT, moist mucus membranes.  Trachea midline, no masses. Cardiovascular: No clubbing, cyanosis, or edema. Respiratory: Normal respiratory effort, no increased work of breathing. GI: Abdomen is soft, nontender, nondistended, no abdominal masses GU: No CVA tenderness.  Lymph: No cervical or inguinal lymphadenopathy. Skin: No rashes, bruises or suspicious lesions. Neurologic: Grossly intact, no focal deficits, moving all 4 extremities. Psychiatric: Normal mood and affect.  Laboratory Data: Lab Results  Component Value Date   WBC 6.7 04/11/2019   HGB 15.3 04/11/2019   HCT 44.1 04/11/2019   MCV 92.7 04/11/2019   PLT 203.0 04/11/2019    Lab Results  Component Value Date   CREATININE 1.07 02/14/2019    No results found for: PSA  No results found for: TESTOSTERONE  No results found for: HGBA1C  Urinalysis    Component Value Date/Time   APPEARANCEUR Clear 12/21/2023 0920   GLUCOSEU 3+ (A) 12/21/2023 0920   BILIRUBINUR Negative 12/21/2023 0920   PROTEINUR Negative 12/21/2023 0920   NITRITE Negative 12/21/2023 0920   LEUKOCYTESUR Negative 12/21/2023 0920    Lab Results  Component Value Date   LABMICR Comment 12/21/2023    Pertinent Imaging:  No results found for this or any previous visit.  No results found for this or any previous visit.  No results found for this or any previous visit.  No results found for  this or any previous visit.  No results found for this or any previous visit.  No results found for this or any previous visit.  No results found for this or any previous visit.  No results found for this or any previous visit.   Assessment & Plan:    1. Erectile dysfunction due to arterial insufficiency (Primary) Continue trimix prn. Patient also wishes to try a vacuum erection device  2. BPh with urinary frequency -uroxatral  10mg  qhs   No follow-ups on file.  Belvie Clara, MD  [facility]       [1] No Known Allergies  "

## 2024-08-01 NOTE — Patient Instructions (Signed)

## 2024-08-05 ENCOUNTER — Encounter (INDEPENDENT_AMBULATORY_CARE_PROVIDER_SITE_OTHER): Payer: Self-pay | Admitting: Otolaryngology

## 2024-08-05 ENCOUNTER — Ambulatory Visit (INDEPENDENT_AMBULATORY_CARE_PROVIDER_SITE_OTHER): Admitting: Otolaryngology

## 2024-08-05 ENCOUNTER — Ambulatory Visit (INDEPENDENT_AMBULATORY_CARE_PROVIDER_SITE_OTHER): Admitting: Audiology

## 2024-08-05 VITALS — BP 92/55 | HR 85 | Ht 71.0 in | Wt 200.0 lb

## 2024-08-05 DIAGNOSIS — H9313 Tinnitus, bilateral: Secondary | ICD-10-CM | POA: Diagnosis not present

## 2024-08-05 DIAGNOSIS — H903 Sensorineural hearing loss, bilateral: Secondary | ICD-10-CM | POA: Diagnosis not present

## 2024-08-05 NOTE — Progress Notes (Signed)
" °  [address] [place], [place] [address] [phone]  Audiological Evaluation    Name: Greg Oneill     DOB:   [birth year]      MRN:   [id number]                                                                                     Service Date: 08/05/2024     Accompanied by: self    Patient comes today after Dr. Karis, ENT sent a referral for a hearing evaluation due to concerns with tinnitus.   Symptoms Yes Details  Hearing loss  [x]  06-18-23:Right ear- Normal to profound essentially sensorineural hearing loss from [phone], with an air-bone gap at 4000 Hz.   Left ear- Mild to severe sensorineural hearing loss from 250 Hz - 8000 Hz.  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 Left ear:  Clear external ear canal and notable landmarks visualized on the tympanic membrane.  Tympanometry: Right ear: Type As - Normal external ear canal volume with normal middle ear pressure and low tympanic membrane compliance. Findings are consistent with reduced eardrum mobility. Left ear: Type As - Normal external ear canal volume with normal middle ear pressure and low tympanic membrane compliance. Findings are consistent with reduced eardrum mobility.  Hearing Evaluation The hearing test results were completed under headphones and results are deemed to be of good reliability. Test technique:  conventional    Pure tone Audiometry: Right ear- Borderline normal to moderately severe sensorineural hearing loss from 250 Hz - 8000 Hz. Left ear-  Borderline  normal to severe sensorineural hearing loss from 250 Hz - 8000 Hz.  Speech Audiometry: Right ear- Speech Reception Threshold (SRT) was obtained at 25 dBHL. Left ear-Speech Reception Threshold (SRT) was obtained at 35 dBHL.   Word Recognition Score Tested using NU-6 (recorded) Right ear: 92% was obtained at a presentation level of 75 dBHL with contralateral masking which is deemed as  excellent. Left ear: 92% was obtained at a presentation level of 75 dBHL with contralateral masking which is deemed as  excellent.   Impression: There is a slight difference on pure tone thresholds, worse in the left ear.  Recommendations: Follow up with ENT as scheduled. Return for a hearing evaluation if concerns with hearing changes arise or per MD recommendation. Hearing aid check to verify hearing aid function, with his audiologist or hearing aid dispenser.   Adaleen Hulgan MARIE LEROUX-MARTINEZ, AUD  "

## 2024-08-06 NOTE — Progress Notes (Signed)
 Patient ID: Greg Oneill, male   DOB: [birth year], 69 y.o.   MRN: [id number]  Follow-up: Bilateral hearing loss, bilateral tinnitus  HPI: The patient is a 69 year old male who returns today for his follow-up evaluation.  The patient has a history of bilateral high-frequency sensorineural hearing loss and bilateral tinnitus.  He also has a history of eustachian tube dysfunction.  He was previously treated with Flonase nasal spray and Valsalva exercise.  The strategies to cope with tinnitus were also discussed.  The patient returns today complaining of persistent bilateral tinnitus.  The severity of his tinnitus has recently increased.  He denies any otalgia, otorrhea, or vertigo.  Exam: General: Communicates without difficulty, well nourished, no acute distress. Head: Normocephalic, no evidence injury, no tenderness, facial buttresses intact without stepoff. Face/sinus: No tenderness to palpation and percussion. Facial movement is normal and symmetric. Eyes: PERRL, EOMI. No scleral icterus, conjunctivae clear. Neuro: CN II exam reveals vision grossly intact.  No nystagmus at any point of gaze. Ears: Auricles well formed without lesions.  Ear canals are intact without mass or lesion.  No erythema or edema is appreciated.  The TMs are intact without fluid. Nose: External evaluation reveals normal support and skin without lesions.  Dorsum is intact.  Anterior rhinoscopy reveals congested mucosa over anterior aspect of inferior turbinates and intact septum.  No purulence noted. Oral:  Oral cavity and oropharynx are intact, symmetric, without erythema or edema.  Mucosa is moist without lesions. Neck: Full range of motion without pain.  There is no significant lymphadenopathy.  No masses palpable.  Thyroid bed within normal limits to palpation.  Parotid glands and submandibular glands equal bilaterally without mass.  Trachea is midline. Neuro:  CN 2-12 grossly intact.     Audiometric evaluation shows stable  bilateral high-frequency sensorineural hearing loss.   Assessment: 1.  Stable bilateral high-frequency sensorineural hearing loss, likely secondary to presbycusis. 2.  His ear canals, tympanic membranes, and middle ear spaces are normal. 3.  Bilateral tinnitus, likely secondary to his sensorineural hearing loss.   Plan: 1.  The physical exam findings and the hearing test results are reviewed with the patient. 2.  The patient is reassured that his hearing thresholds are stable. 3.  The strategies to cope with tinnitus, including the use of masker, hearing aids, tinnitus retraining therapy, and avoidance of caffeine and alcohol are discussed. 4.  The patient is a candidate for hearing amplification.  The hearing aid options are discussed. 4.  The patient will return for reevaluation in 1 year.

## 2024-09-08 ENCOUNTER — Ambulatory Visit: Admitting: Internal Medicine

## 2025-02-11 ENCOUNTER — Ambulatory Visit: Admitting: Urology

## 2025-04-30 ENCOUNTER — Ambulatory Visit: Admitting: Adult Health
